# Patient Record
Sex: Female | Born: 1935 | Race: White | Hispanic: No
Health system: Southern US, Community
[De-identification: ages and names within clinical notes are randomized; demographics above are authoritative.]

## PROBLEM LIST (undated history)

## (undated) DIAGNOSIS — G4730 Sleep apnea, unspecified: Secondary | ICD-10-CM

## (undated) DIAGNOSIS — I341 Nonrheumatic mitral (valve) prolapse: Secondary | ICD-10-CM

## (undated) DIAGNOSIS — G629 Polyneuropathy, unspecified: Secondary | ICD-10-CM

## (undated) DIAGNOSIS — I2510 Atherosclerotic heart disease of native coronary artery without angina pectoris: Secondary | ICD-10-CM

## (undated) DIAGNOSIS — I10 Essential (primary) hypertension: Secondary | ICD-10-CM

## (undated) DIAGNOSIS — F0390 Unspecified dementia without behavioral disturbance: Secondary | ICD-10-CM

## (undated) DIAGNOSIS — G459 Transient cerebral ischemic attack, unspecified: Secondary | ICD-10-CM

---

## 2020-05-01 ENCOUNTER — Emergency Department (HOSPITAL_COMMUNITY): Payer: Medicare (Managed Care)

## 2020-05-01 ENCOUNTER — Other Ambulatory Visit: Payer: Self-pay

## 2020-05-01 ENCOUNTER — Emergency Department (HOSPITAL_COMMUNITY)
Admission: EM | Admit: 2020-05-01 | Discharge: 2020-05-02 | Disposition: A | Discharge status: Home / Self Care | Payer: Medicare (Managed Care) | Attending: Emergency Medicine | Admitting: Emergency Medicine

## 2020-05-01 ENCOUNTER — Encounter (HOSPITAL_COMMUNITY): Payer: Self-pay | Admitting: Radiology

## 2020-05-01 DIAGNOSIS — F0390 Unspecified dementia without behavioral disturbance: Secondary | ICD-10-CM | POA: Insufficient documentation

## 2020-05-01 DIAGNOSIS — W1789XA Other fall from one level to another, initial encounter: Secondary | ICD-10-CM | POA: Diagnosis not present

## 2020-05-01 DIAGNOSIS — W19XXXA Unspecified fall, initial encounter: Secondary | ICD-10-CM

## 2020-05-01 DIAGNOSIS — I6782 Cerebral ischemia: Secondary | ICD-10-CM | POA: Insufficient documentation

## 2020-05-01 DIAGNOSIS — M25551 Pain in right hip: Secondary | ICD-10-CM | POA: Diagnosis present

## 2020-05-01 NOTE — Discharge Instructions (Addendum)
CT head and neck without any acute injury.  May be age-indeterminate endplate T1 injury.  Patient without any tenderness there so probably old.  But this would be stable if even acute.  X-rays of both hips and pelvis without any bony abnormalities.  Patient stable for discharge back to facility.

## 2020-05-01 NOTE — ED Provider Notes (Signed)
[facility]-EMERGENCY DEPT Provider Note   CSN: [id number] Arrival date & time: 05/01/20  1856     History Chief Complaint  Patient presents with  . Fall    Maureen Schaefer is a 86 y.o. female.  Patient brought in from [facility].  According patient's daughter that she was told that patient stood up and fell backwards.  They stated that she was complaining of left side however has seem to have pain on the right hip area.  She arrived with c-collar in place.  Patient has dementia according to daughter.  Did have her Covid vaccines.  Patient denies any complaints currently.  Daughter says mental status is baseline.        History reviewed. No pertinent past medical history.  There are no problems to display for this patient.     OB History   No obstetric history on file.     No family history on file.  Social History   Tobacco Use  . Smoking status: Not on file  Substance Use Topics  . Alcohol use: Not on file  . Drug use: Not on file    Home Medications Prior to Admission medications   Medication Sig Start Date End Date Taking? Authorizing Provider  acetaminophen (TYLENOL) 500 MG tablet Take 1,000 mg by mouth in the morning, at noon, and at bedtime.   Yes [provider]  alum & mag hydroxide-simeth (GERI-LANTA) 200-200-20 MG/5ML suspension Take 30 mLs by mouth every 8 (eight) hours as needed for indigestion or heartburn.   Yes [provider]  ARIPiprazole (ABILIFY) 2 MG tablet Take 2 mg by mouth daily.   Yes [provider]  celecoxib (CELEBREX) 100 MG capsule Take 100 mg by mouth daily.   Yes [provider]  cholecalciferol (VITAMIN D3) 25 MCG (1000 UNIT) tablet Take 1,000 Units by mouth daily.   Yes [provider]  Cranberry 200 MG CAPS Take 600 mg by mouth daily.   Yes [provider]  donepezil (ARICEPT) 10 MG tablet Take 10 mg by mouth at bedtime.   Yes [provider]    escitalopram (LEXAPRO) 10 MG tablet Take 10 mg by mouth daily.   Yes [provider]  estradiol (ESTRACE) 0.1 MG/GM vaginal cream Place 1 Applicatorful vaginally 3 (three) times a week.   Yes [provider]  levothyroxine (SYNTHROID) 50 MCG tablet Take 50 mcg by mouth daily before breakfast.   Yes [provider]  loperamide (IMODIUM A-D) 2 MG tablet Take 2 mg by mouth daily as needed for diarrhea or loose stools.   Yes [provider]  magnesium hydroxide (MILK OF MAGNESIA) 400 MG/5ML suspension Take 30 mLs by mouth daily as needed (diarrhea).   Yes [provider]  metoprolol tartrate (LOPRESSOR) 25 MG tablet Take 25 mg by mouth daily.   Yes [provider]  nystatin cream (MYCOSTATIN) Apply 1 application topically daily.   Yes [provider]  polyethylene glycol (MIRALAX / GLYCOLAX) 17 g packet Take 17 g by mouth daily.   Yes [provider]  polyvinyl alcohol (LIQUIFILM TEARS) 1.4 % ophthalmic solution Place 2 drops into both eyes in the morning, at noon, and at bedtime.   Yes [provider]  rosuvastatin (CRESTOR) 5 MG tablet Take 5 mg by mouth as directed. Take on MWF   Yes [provider]  triamcinolone (NASACORT) 55 MCG/ACT AERO nasal inhaler Place 2 sprays into the nose daily.   Yes  [provider]    Allergies    Coconut flavor  Review of Systems   Review of Systems  Unable to perform ROS: Dementia    Physical Exam Updated Vital Signs BP (!) 174/67 (BP Location: Right Arm)   Pulse (!) 58   Temp 98.6 F (37 C)   Resp 16   SpO2 98%   Physical Exam Vitals and nursing note reviewed.  Constitutional:      General: She is not in acute distress.    Appearance: Normal appearance. She is well-developed.  HENT:     Head: Normocephalic and atraumatic.  Eyes:     Extraocular Movements: Extraocular movements intact.     Conjunctiva/sclera: Conjunctivae normal.     Pupils:  Pupils are equal, round, and reactive to light.  Neck:     Comments: C-collar in place. Cardiovascular:     Rate and Rhythm: Normal rate and regular rhythm.     Heart sounds: No murmur heard.   Pulmonary:     Effort: Pulmonary effort is normal. No respiratory distress.     Breath sounds: Normal breath sounds.  Abdominal:     Palpations: Abdomen is soft.     Tenderness: There is no abdominal tenderness.  Musculoskeletal:        General: No tenderness or deformity.     Cervical back: Neck supple.     Comments: No pain with range of motion of both lower legs.  In particular no pain in the hip area.  No deformity.  Skin:    General: Skin is warm and dry.  Neurological:     Mental Status: She is alert. Mental status is at baseline.     Comments: Patient alert     ED Results / Procedures / Treatments   Labs (all labs ordered are listed, but only abnormal results are displayed) Labs Reviewed - No data to display  EKG None  Radiology CT Head Wo Contrast  Result Date: 05/01/2020 CLINICAL DATA:  Found on floor EXAM: CT HEAD WITHOUT CONTRAST CT CERVICAL SPINE WITHOUT CONTRAST TECHNIQUE: Multidetector CT imaging of the head and cervical spine was performed following the standard protocol without intravenous contrast. Multiplanar CT image reconstructions of the cervical spine were also generated. COMPARISON:  None. FINDINGS: CT HEAD FINDINGS Brain: No acute territorial infarction, hemorrhage or intracranial mass. Advanced atrophy. Moderate hypodensity in the white matter consistent with chronic small vessel ischemic change. Mildly prominent ventricles, felt secondary to atrophy. Chronic appearing lacunar infarcts in the right basal ganglia. Vascular: No hyperdense vessels.  Carotid vascular calcification Skull: Normal. Negative for fracture or focal lesion. Sinuses/Orbits: No acute finding. Other: None CT CERVICAL SPINE FINDINGS Alignment: Trace anterolisthesis C3 on C4, C4 on C5 and trace  retrolisthesis C5 on C6. Facet alignment is maintained. Skull base and vertebrae: Age indeterminate mild superior endplate deformity at T1. Remaining vertebral bodies demonstrate normal stature. Soft tissues and spinal canal: No prevertebral fluid or swelling. No visible canal hematoma. Disc levels: Degenerative changes at multiple levels, most advanced at C5-C6. Facet degenerative changes at multiple levels. Upper chest: Negative. Other: None IMPRESSION: 1. No CT evidence for acute intracranial abnormality. Atrophy and chronic small vessel ischemic changes of the white matter. 2. Age indeterminate mild superior endplate deformity at T1. Otherwise no acute osseous abnormality of the cervical spine. Electronically Signed   By: Jasmine Pang M.D.   On: 05/01/2020 22:30   CT Cervical Spine Wo Contrast  Result Date: 05/01/2020 CLINICAL DATA:  Found on  floor EXAM: CT HEAD WITHOUT CONTRAST CT CERVICAL SPINE WITHOUT CONTRAST TECHNIQUE: Multidetector CT imaging of the head and cervical spine was performed following the standard protocol without intravenous contrast. Multiplanar CT image reconstructions of the cervical spine were also generated. COMPARISON:  None. FINDINGS: CT HEAD FINDINGS Brain: No acute territorial infarction, hemorrhage or intracranial mass. Advanced atrophy. Moderate hypodensity in the white matter consistent with chronic small vessel ischemic change. Mildly prominent ventricles, felt secondary to atrophy. Chronic appearing lacunar infarcts in the right basal ganglia. Vascular: No hyperdense vessels.  Carotid vascular calcification Skull: Normal. Negative for fracture or focal lesion. Sinuses/Orbits: No acute finding. Other: None CT CERVICAL SPINE FINDINGS Alignment: Trace anterolisthesis C3 on C4, C4 on C5 and trace retrolisthesis C5 on C6. Facet alignment is maintained. Skull base and vertebrae: Age indeterminate mild superior endplate deformity at T1. Remaining vertebral bodies demonstrate normal  stature. Soft tissues and spinal canal: No prevertebral fluid or swelling. No visible canal hematoma. Disc levels: Degenerative changes at multiple levels, most advanced at C5-C6. Facet degenerative changes at multiple levels. Upper chest: Negative. Other: None IMPRESSION: 1. No CT evidence for acute intracranial abnormality. Atrophy and chronic small vessel ischemic changes of the white matter. 2. Age indeterminate mild superior endplate deformity at T1. Otherwise no acute osseous abnormality of the cervical spine. Electronically Signed   By: Jasmine Pang M.D.   On: 05/01/2020 22:30   DG Hip Unilat W or Wo Pelvis 2-3 Views Right  Result Date: 05/01/2020 CLINICAL DATA:  Fall, right hip pain EXAM: DG HIP (WITH OR WITHOUT PELVIS) 2-3V RIGHT COMPARISON:  None. FINDINGS: Single view radiograph the pelvis and two view radiograph of the right hip demonstrates surgical changes of bilateral total hip arthroplasty. Left hip arthroplasty is incompletely visualized on this exam. Normal alignment. No fracture or dislocation. Soft tissues are unremarkable. IMPRESSION: Bilateral total hip arthroplasty. No acute findings. Electronically Signed   By: Helyn Numbers MD   On: 05/01/2020 21:22    Procedures Procedures (including critical care time)  Medications Ordered in ED Medications - No data to display  ED Course  I have reviewed the triage vital signs and the nursing notes.  Pertinent labs & imaging results that were available during my care of the patient were reviewed by me and considered in my medical decision making (see chart for details).    MDM Rules/Calculators/A&P                          Work-up for the fall CT head without any acute findings.  CT cervical spine raise some concerns about T1 endplate injury.  But age-indeterminate.  Patient not tender once c-collar was removed in that area for sure.  Patient did complain of some tenderness from the top of the C-spine all the way down to the bottom  of the C-spine.  But did not seem to be any more tender around T1.  X-rays of both hips and pelvis without any bony abnormality.  Patient appears to be in no distress.  Patient stable for discharge back to nursing facility. Final Clinical Impression(s) / ED Diagnoses Final diagnoses:  Fall, initial encounter    Rx / DC Orders ED Discharge Orders    None       Vanetta Mulders, MD 05/01/20 2350

## 2020-05-01 NOTE — ED Triage Notes (Signed)
She was found lying on the floor on her left side, however, she c/o right hip area pain. She arrives in a c-collar, which we maintain. She arrived at [facility] ~ 2 days ago. She sobs and complains incessantly, which staff at [facility] tell EMS she has done since her arrival there. She no longer c/o any hip pain.

## 2020-05-02 NOTE — ED Notes (Signed)
[facility] called for transport, states unknown ETA

## 2020-05-25 ENCOUNTER — Emergency Department (HOSPITAL_COMMUNITY)
Admission: EM | Admit: 2020-05-25 | Discharge: 2020-05-25 | Disposition: A | Discharge status: Skilled Nursing Facility | Payer: PRIVATE HEALTH INSURANCE | Attending: Emergency Medicine | Admitting: Emergency Medicine

## 2020-05-25 ENCOUNTER — Other Ambulatory Visit: Payer: Self-pay

## 2020-05-25 ENCOUNTER — Emergency Department (HOSPITAL_COMMUNITY): Payer: PRIVATE HEALTH INSURANCE

## 2020-05-25 ENCOUNTER — Encounter (HOSPITAL_COMMUNITY): Payer: Self-pay

## 2020-05-25 DIAGNOSIS — Z79899 Other long term (current) drug therapy: Secondary | ICD-10-CM | POA: Diagnosis not present

## 2020-05-25 DIAGNOSIS — I10 Essential (primary) hypertension: Secondary | ICD-10-CM | POA: Insufficient documentation

## 2020-05-25 DIAGNOSIS — Y929 Unspecified place or not applicable: Secondary | ICD-10-CM | POA: Diagnosis not present

## 2020-05-25 DIAGNOSIS — I2510 Atherosclerotic heart disease of native coronary artery without angina pectoris: Secondary | ICD-10-CM | POA: Insufficient documentation

## 2020-05-25 DIAGNOSIS — S0083XA Contusion of other part of head, initial encounter: Secondary | ICD-10-CM | POA: Insufficient documentation

## 2020-05-25 DIAGNOSIS — W19XXXA Unspecified fall, initial encounter: Secondary | ICD-10-CM

## 2020-05-25 DIAGNOSIS — Y939 Activity, unspecified: Secondary | ICD-10-CM | POA: Diagnosis not present

## 2020-05-25 DIAGNOSIS — R0789 Other chest pain: Secondary | ICD-10-CM | POA: Insufficient documentation

## 2020-05-25 DIAGNOSIS — W1839XA Other fall on same level, initial encounter: Secondary | ICD-10-CM | POA: Insufficient documentation

## 2020-05-25 DIAGNOSIS — Y999 Unspecified external cause status: Secondary | ICD-10-CM | POA: Diagnosis not present

## 2020-05-25 DIAGNOSIS — F0390 Unspecified dementia without behavioral disturbance: Secondary | ICD-10-CM | POA: Diagnosis not present

## 2020-05-25 HISTORY — DX: Nonrheumatic mitral (valve) prolapse: I34.1

## 2020-05-25 HISTORY — DX: Transient cerebral ischemic attack, unspecified: G45.9

## 2020-05-25 HISTORY — DX: Essential (primary) hypertension: I10

## 2020-05-25 HISTORY — DX: Sleep apnea, unspecified: G47.30

## 2020-05-25 HISTORY — DX: Unspecified dementia, unspecified severity, without behavioral disturbance, psychotic disturbance, mood disturbance, and anxiety: F03.90

## 2020-05-25 HISTORY — DX: Polyneuropathy, unspecified: G62.9

## 2020-05-25 HISTORY — DX: Atherosclerotic heart disease of native coronary artery without angina pectoris: I25.10

## 2020-05-25 MED ORDER — ACETAMINOPHEN 500 MG PO TABS: 1000.0000 mg | ORAL_TABLET | Freq: Four times a day (QID) | ORAL | 0 refills | Status: DC | PRN

## 2020-05-25 NOTE — ED Provider Notes (Signed)
[facility]-EMER[facility]PT Provider Note   CSN: [id number] Arrival date & time: 05/25/20  1856     History Chief Complaint  Patient presents with  . Fall    Maureen Schaefer is a 86 y.o. female.  HPI Patient had a fall at SNF yesterday.  She was not transported at that time.  Today, nursing staff had concerned that patient expressed right hip pain.  Patient had a contusion to the face yesterday that was bandaged.  Patient cannot give any additional history.  She has severe dementia.    Past Medical History:  Diagnosis Date  . CAD (coronary artery disease)   . Dementia (HCC)   . Hypertension   . Mitral valve prolapse   . Polyneuropathy   . Sleep apnea   . TIA (transient ischemic attack)     There are no problems to display for this patient.      OB History   No obstetric history on file.     No family history on file.  Social History   Tobacco Use  . Smoking status: Unknown If Ever Smoked  Substance Use Topics  . Alcohol use: Not Currently  . Drug use: Never    Home Medications Prior to Admission medications   Medication Sig Start Date End Date Taking? Authorizing Provider  acetaminophen (TYLENOL) 500 MG tablet Take 1,000 mg by mouth in the morning, at noon, and at bedtime.    [provider]  acetaminophen (TYLENOL) 500 MG tablet Take 2 tablets (1,000 mg total) by mouth every 6 (six) hours as needed for moderate pain. 05/25/20   Arby Barrette, MD  alum & mag hydroxide-simeth [facility]) 200-200-20 MG/5ML suspension Take 30 mLs by mouth every 8 (eight) hours as needed for indigestion or heartburn.    [provider]  ARIPiprazole (ABILIFY) 2 MG tablet Take 2 mg by mouth daily.    [provider]  celecoxib (CELEBREX) 100 MG capsule Take 100 mg by mouth daily.    [provider]  cholecalciferol (VITAMIN D3) 25 MCG (1000 UNIT) tablet Take 1,000 Units by mouth daily.    [provider]  Cranberry  200 MG CAPS Take 600 mg by mouth daily.    [provider]  donepezil (ARICEPT) 10 MG tablet Take 10 mg by mouth at bedtime.    [provider]  escitalopram (LEXAPRO) 10 MG tablet Take 10 mg by mouth daily.    [provider]  estradiol (ESTRACE) 0.1 MG/GM vaginal cream Place 1 Applicatorful vaginally 3 (three) times a week.    [provider]  levothyroxine (SYNTHROID) 50 MCG tablet Take 50 mcg by mouth daily before breakfast.    [provider]  loperamide (IMODIUM A-D) 2 MG tablet Take 2 mg by mouth daily as needed for diarrhea or loose stools.    [provider]  magnesium hydroxide (MILK OF MAGNESIA) 400 MG/5ML suspension Take 30 mLs by mouth daily as needed (diarrhea).    [provider]  metoprolol tartrate (LOPRESSOR) 25 MG tablet Take 25 mg by mouth daily.    [provider]  nystatin cream (MYCOSTATIN) Apply 1 application topically daily.    [provider]  polyethylene glycol (MIRALAX / GLYCOLAX) 17 g packet Take 17 g by mouth daily.    [provider]  polyvinyl alcohol (LIQUIFILM TEARS) 1.4 % ophthalmic solution Place 2 drops into both eyes in the morning, at noon, and at bedtime.    [provider]  rosuvastatin (CRESTOR) 5 MG tablet Take 5 mg by mouth as directed. Take on MWF    [provider]  triamcinolone (NASACORT) 55 MCG/ACT AERO nasal inhaler Place 2 sprays into the nose daily.    [provider]    Allergies    Coconut flavor  Review of Systems   Review of Systems Level 5 caveat cannot obtain review of systems due to dementia physical Exam Updated Vital Signs BP (!) 155/72 (BP Location: Right Arm)   Pulse 74   Temp 97.6 F (36.4 C) (Oral)   Resp 17   SpO2 97%   Physical Exam Constitutional:      Comments: Patient resting quietly as I enter the room.  No evidence of distress.  No respiratory distress.  Once awake she is responsive and  appropriately interactive but clearly has dementia.  HENT:     Head:     Comments: Periorbital hematoma with only mild swelling on the left.  A small brow laceration that is clean and dry without active bleeding.  No other facial contusions or abrasions.    Nose: Nose normal.     Mouth/Throat:     Mouth: Mucous membranes are moist.     Pharynx: Oropharynx is clear.  Eyes:     Extraocular Movements: Extraocular movements intact.     Pupils: Pupils are equal, round, and reactive to light.     Comments: Normal extraocular motions.  Neck:     Comments: Denies any C-spine tenderness to palpation. Cardiovascular:     Rate and Rhythm: Normal rate and regular rhythm.     Comments: Occasional ectopy. Pulmonary:     Effort: Pulmonary effort is normal.     Breath sounds: Normal breath sounds.     Comments: Patient generally endorses some tenderness to palpation of the chest wall.  However chest wall is normal in appearance without any abrasions or contusions.  No crepitus. Abdominal:     General: There is no distension.     Palpations: Abdomen is soft.     Comments: Abdomen is soft and nondistended.  Patient diffusely endorses discomfort anywhere I palpate.  No guarding.  Musculoskeletal:     Cervical back: Neck supple.     Comments: Extremities are in good condition.  There are no deformities or areas of swelling.  I can move both arms without any sign of injury.  Both lower extremities are symmetric.  They are in very good condition for age.  There are no effusions contusions or abrasions.  Patient however complains of pain anytime I move either leg at all.  Skin:    General: Skin is warm and dry.  Neurological:     Comments: Patient is alert but clearly confused.  She does not appear to have focal motor deficits.     ED Results / Procedures / Treatments   Labs (all labs ordered are listed, but only abnormal results are displayed) Labs Reviewed - No data to  display  EKG None  Radiology CT Head Wo Contrast  Result Date: 05/25/2020 CLINICAL DATA:  Head trauma, minor (Age >= 65y) EXAM: CT HEAD WITHOUT CONTRAST TECHNIQUE: Contiguous axial images were obtained from the base of the skull through the vertex without intravenous contrast. COMPARISON:  Recent prior head CT 05/01/2020 FINDINGS: Brain: No evidence of acute infarction, hemorrhage, hydrocephalus, extra-axial collection or mass lesion/mass effect. Similar degree of significant cortical and central atrophy with resultant ex vacuo dilatation of the lateral ventricles. Periventricular and subcortical white matter  hypoattenuation also remains unchanged. While nonspecific, the appearance is most consistent with chronic microvascular ischemic white matter disease. Vascular: Atherosclerotic calcifications in the bilateral cavernous carotid arteries. Skull: Normal. Negative for fracture or focal lesion. Sinuses/Orbits: No acute finding. Other: None. IMPRESSION: 1. No acute intracranial abnormality. 2. Stable appearance of atrophy, ex vacuo ventriculomegaly and chronic microvascular ischemic white matter disease compared to recent prior imaging. Electronically Signed   By: Malachy Moan M.D.   On: 05/25/2020 09:37   DG Hip Unilat With Pelvis 2-3 Views Right  Result Date: 05/25/2020 CLINICAL DATA:  Larey Seat 1 day ago.  Right hip pain.  Pelvic pain. EXAM: DG HIP (WITH OR WITHOUT PELVIS) 2-3V RIGHT COMPARISON:  05/01/2020 FINDINGS: Right total hip arthroplasty is well seated and well aligned. No fracture. No bone lesion. Well-positioned left total hip arthroplasty. SI joints and symphysis pubis are normally spaced and aligned. Soft tissues are unremarkable. IMPRESSION: 1. No fracture, bone lesion or evidence of loosening of the hip joint orthopedic hardware. Electronically Signed   By: Amie Portland M.D.   On: 05/25/2020 09:40    Procedures Procedures (including critical care time)  Medications Ordered in  ED Medications - No data to display  ED Course  I have reviewed the triage vital signs and the nursing notes.  Pertinent labs & imaging results that were available during my care of the patient were reviewed by me and considered in my medical decision making (see chart for details).    MDM Rules/Calculators/A&P                         Patient fell yesterday.  The stated concern was that nursing staff perceived patient had right hip pain.  Physical exam only identifies periorbital hematoma on the left.  Otherwise, there are no areas of contusions abrasions, deformities.  Unfortunately, patient generally endorses pain everywhere.  I have obtain CT head due to objective head injury no acute findings noted.  Also based on nursing concern for hip pain pelvis and right hip obtained without acute findings.  At this time by physical exam there are no other suspected fracture injuries.  Patient will return to nursing care and can have acetaminophen every 6 hours as needed for pain.  Recommendation is for reexamination by facility provider for any concerns. Final Clinical Impression(s) / ED Diagnoses Final diagnoses:  Fall, initial encounter  Contusion of face, initial encounter    Rx / DC Orders ED Discharge Orders         Ordered    acetaminophen (TYLENOL) 500 MG tablet  Every 6 hours PRN        05/25/20 0948           Arby Barrette, MD 05/25/20 [phone]

## 2020-05-25 NOTE — ED Notes (Signed)
Report given to [facility] at [facility].

## 2020-05-25 NOTE — Discharge Instructions (Signed)
1.  CT scan of the head does not show any bleeding or internal brain injury.  X-ray was taken of the patient's hips and pelvis without identification of fracture. 2.  Patient complains of pain in all examined locations however only objective injury identified is facial contusion.  No apparent extremity fractures or significant injuries by physical exam.  Continue to care for the patient to provide comfort and return if other concerning findings are identified.  Please have patient reexamined by facility physician within the next several days. 3.  Give extra strength Tylenol every 6-8 hours as needed.

## 2020-05-25 NOTE — ED Notes (Signed)
[facility] notified need for transport  

## 2020-05-25 NOTE — ED Triage Notes (Addendum)
Pt arrives EMS from [facility]  Per EMS: pt had a fall yesterday at the facility- family refused transportation to the hospital yesterday. This am staff reports that the pt is complaining of right hip and leg pain. No obvious deformity. Pt is altered at baseline. Per staff: pt is at baseline. EMS notes a hematoma above left eye and lac to the brow that was bandaged.

## 2020-08-01 ENCOUNTER — Encounter (HOSPITAL_COMMUNITY): Payer: Self-pay

## 2020-08-01 ENCOUNTER — Emergency Department (HOSPITAL_COMMUNITY)
Admission: EM | Admit: 2020-08-01 | Discharge: 2020-08-01 | Disposition: A | Discharge status: Home / Self Care | Payer: Medicare Other | Attending: Emergency Medicine | Admitting: Emergency Medicine

## 2020-08-01 ENCOUNTER — Emergency Department (HOSPITAL_COMMUNITY): Payer: Medicare Other

## 2020-08-01 DIAGNOSIS — I10 Essential (primary) hypertension: Secondary | ICD-10-CM | POA: Insufficient documentation

## 2020-08-01 DIAGNOSIS — I2510 Atherosclerotic heart disease of native coronary artery without angina pectoris: Secondary | ICD-10-CM | POA: Insufficient documentation

## 2020-08-01 DIAGNOSIS — S0990XA Unspecified injury of head, initial encounter: Secondary | ICD-10-CM | POA: Insufficient documentation

## 2020-08-01 DIAGNOSIS — W050XXA Fall from non-moving wheelchair, initial encounter: Secondary | ICD-10-CM | POA: Insufficient documentation

## 2020-08-01 DIAGNOSIS — Z79899 Other long term (current) drug therapy: Secondary | ICD-10-CM | POA: Diagnosis not present

## 2020-08-01 DIAGNOSIS — R001 Bradycardia, unspecified: Secondary | ICD-10-CM | POA: Diagnosis not present

## 2020-08-01 DIAGNOSIS — F0390 Unspecified dementia without behavioral disturbance: Secondary | ICD-10-CM | POA: Diagnosis not present

## 2020-08-01 DIAGNOSIS — Y92129 Unspecified place in nursing home as the place of occurrence of the external cause: Secondary | ICD-10-CM | POA: Insufficient documentation

## 2020-08-01 NOTE — Discharge Instructions (Addendum)
RETURN TO ER IF ANY VOMITING, LETHARGY, CONFUSION, OR SUDDEN CHANGES IN MENTAL STATUS.

## 2020-08-01 NOTE — ED Triage Notes (Signed)
BIB EMS from [facility] for a forward fall from w/c with no injury noted, but baseline of dementia and therefore c spine immobilized for protocol. Patient with c/o generalized pain.

## 2020-08-01 NOTE — ED Provider Notes (Signed)
[name] EMERGENCY DEPARTMENT Provider Note   CSN: [id number] Arrival date & time: 08/01/20  1035     History Chief Complaint  Patient presents with  . Fall    Maureen Schaefer is a 86 y.o. female.  86yo F w/ PMH below including dementia, CAD, HTN, TIA who p/w fall. Just PTA, pt was at her nursing facility when she fell forward out of a wheelchair and hit her head. No LOC, is at neuro baseline per facility. No apparent injuries from the fall. Placed in C-collar by EMS. Stable VS during transport.  LEVEL 5 CAVEAT DUE TO DEMENTIA  The history is provided by the EMS personnel and the nursing home.  Fall       Past Medical History:  Diagnosis Date  . CAD (coronary artery disease)   . Dementia (HCC)   . Hypertension   . Mitral valve prolapse   . Polyneuropathy   . Sleep apnea   . TIA (transient ischemic attack)     There are no problems to display for this patient.    OB History   No obstetric history on file.     No family history on file.  Social History   Tobacco Use  . Smoking status: Unknown If Ever Smoked  Substance Use Topics  . Alcohol use: Not Currently  . Drug use: Never    Home Medications Prior to Admission medications   Medication Sig Start Date End Date Taking? Authorizing Provider  acetaminophen (TYLENOL) 500 MG tablet Take 1,000 mg by mouth in the morning, at noon, and at bedtime.    [provider]  acetaminophen (TYLENOL) 500 MG tablet Take 2 tablets (1,000 mg total) by mouth every 6 (six) hours as needed for moderate pain. 05/25/20   Arby Barrette, MD  alum & mag hydroxide-simeth [facility]) 200-200-20 MG/5ML suspension Take 30 mLs by mouth every 8 (eight) hours as needed for indigestion or heartburn.    [provider]  ARIPiprazole (ABILIFY) 2 MG tablet Take 2 mg by mouth daily.    [provider]  celecoxib (CELEBREX) 100 MG capsule Take 100 mg by mouth daily.    [provider]    cholecalciferol (VITAMIN D3) 25 MCG (1000 UNIT) tablet Take 1,000 Units by mouth daily.    [provider]  Cranberry 200 MG CAPS Take 600 mg by mouth daily.    [provider]  donepezil (ARICEPT) 10 MG tablet Take 10 mg by mouth at bedtime.    [provider]  escitalopram (LEXAPRO) 20 MG tablet Take 20 mg by mouth daily. 07/31/20   [provider]  estradiol (ESTRACE) 0.1 MG/GM vaginal cream Place 1 Applicatorful vaginally 3 (three) times a week.    [provider]  levothyroxine (SYNTHROID) 50 MCG tablet Take 50 mcg by mouth daily before breakfast.    [provider]  loperamide (IMODIUM A-D) 2 MG tablet Take 2 mg by mouth daily as needed for diarrhea or loose stools.    [provider]  magnesium hydroxide (MILK OF MAGNESIA) 400 MG/5ML suspension Take 30 mLs by mouth daily as needed (diarrhea).    [provider]  metoprolol tartrate (LOPRESSOR) 25 MG tablet Take 25 mg by mouth daily.    [provider]  nystatin cream (MYCOSTATIN) Apply 1 application topically daily.    [provider]  polyethylene glycol (MIRALAX / GLYCOLAX) 17 g packet Take 17 g by mouth daily.    [provider]  polyvinyl alcohol (LIQUIFILM TEARS) 1.4 % ophthalmic solution Place 2 drops into both eyes in the morning, at noon, and at bedtime.    [provider]  rosuvastatin (CRESTOR) 5 MG tablet Take 5 mg by mouth as directed. Take on MWF    [provider]  triamcinolone (NASACORT) 55 MCG/ACT AERO nasal inhaler Place 2 sprays into the nose daily.    [provider]    Allergies    Coconut flavor  Review of Systems   Review of Systems  Unable to perform ROS: Dementia    Physical Exam Updated Vital Signs BP (!) 148/59   Pulse (!) 56   Temp 97.9 F (36.6 C) (Axillary)   Resp 13   Ht 5\' 6"  (1.676 m)   Wt 60 kg Comment: stretcher scale  SpO2 98%   BMI 21.35 kg/m   Physical  Exam Vitals and nursing note reviewed.  Constitutional:      General: She is not in acute distress.    Appearance: She is well-developed.  HENT:     Head: Normocephalic and atraumatic.     Comments: No hematoma or abrasions    Nose: Nose normal.  Eyes:     Conjunctiva/sclera: Conjunctivae normal.     Pupils: Pupils are equal, round, and reactive to light.  Neck:     Comments: In c-collar Cardiovascular:     Rate and Rhythm: Regular rhythm. Bradycardia present.     Heart sounds: Normal heart sounds. No murmur heard.   Pulmonary:     Effort: Pulmonary effort is normal.     Breath sounds: Normal breath sounds.  Chest:     Chest wall: No tenderness.  Abdominal:     General: Bowel sounds are normal. There is no distension.     Palpations: Abdomen is soft.     Tenderness: There is no abdominal tenderness.  Musculoskeletal:        General: No deformity or signs of injury. Normal range of motion.  Skin:    General: Skin is warm and dry.     Comments: Scattered old ecchymoses b/l legs and small abrasion R knee  Neurological:     Mental Status: She is alert.     Comments: Disoriented, fluent speech  Psychiatric:        Cognition and Memory: Memory is impaired.     ED Results / Procedures / Treatments   Labs (all labs ordered are listed, but only abnormal results are displayed) Labs Reviewed - No data to display  EKG None  Radiology No results found.  Procedures Procedures (including critical care time)  Medications Ordered in ED Medications - No data to display  ED Course  I have reviewed the triage vital signs and the nursing notes.  Pertinent imaging results that were available during my care of the patient were reviewed by me and considered in my medical decision making (see chart for details).    MDM Rules/Calculators/A&P                          Pt comfortable on exam, no external signs of trauma. Because of head injury, obtained CT head and c-spine which  were negative for acute injury. Pt stable on reassessment, discharged back to nursing facility with return precautions provided on d/c paperwork.  Final Clinical Impression(s) / ED Diagnoses Final diagnoses:  Minor head injury, initial encounter    Rx / DC Orders ED Discharge Orders  None       Deantae Shackleton, Ambrose Finland, MD 08/01/20 1419

## 2020-08-01 NOTE — ED Notes (Addendum)
Attempted to call report x 3 to facility. First two times phone was answered and then the line was disconnected. The third time the phone was answered and then this RN was sent to voicemail. Will get transport set up for pt

## 2020-08-01 NOTE — ED Notes (Signed)
Pt discharged back to nursing home by [facility]. Daughter aware of discharge instructions.

## 2020-08-01 NOTE — ED Notes (Signed)
[facility] called @ 1456-per Vernona Rieger, RN called by Marylene Land

## 2020-09-12 ENCOUNTER — Inpatient Hospital Stay (HOSPITAL_COMMUNITY)
Admission: EM | Admit: 2020-09-12 | Discharge: 2020-09-17 | DRG: 812 | Disposition: A | Discharge status: Nursing Home (Includes ICF) | Payer: Medicare Other | Attending: Internal Medicine | Admitting: Internal Medicine

## 2020-09-12 ENCOUNTER — Encounter (HOSPITAL_COMMUNITY): Payer: Self-pay

## 2020-09-12 DIAGNOSIS — Z79899 Other long term (current) drug therapy: Secondary | ICD-10-CM

## 2020-09-12 DIAGNOSIS — Z6822 Body mass index (BMI) 22.0-22.9, adult: Secondary | ICD-10-CM

## 2020-09-12 DIAGNOSIS — D500 Iron deficiency anemia secondary to blood loss (chronic): Principal | ICD-10-CM | POA: Diagnosis present

## 2020-09-12 DIAGNOSIS — B9620 Unspecified Escherichia coli [E. coli] as the cause of diseases classified elsewhere: Secondary | ICD-10-CM | POA: Diagnosis present

## 2020-09-12 DIAGNOSIS — Z66 Do not resuscitate: Secondary | ICD-10-CM | POA: Diagnosis present

## 2020-09-12 DIAGNOSIS — D649 Anemia, unspecified: Secondary | ICD-10-CM

## 2020-09-12 DIAGNOSIS — I10 Essential (primary) hypertension: Secondary | ICD-10-CM | POA: Diagnosis present

## 2020-09-12 DIAGNOSIS — Z8673 Personal history of transient ischemic attack (TIA), and cerebral infarction without residual deficits: Secondary | ICD-10-CM | POA: Diagnosis not present

## 2020-09-12 DIAGNOSIS — E440 Moderate protein-calorie malnutrition: Secondary | ICD-10-CM | POA: Diagnosis present

## 2020-09-12 DIAGNOSIS — F0390 Unspecified dementia without behavioral disturbance: Secondary | ICD-10-CM | POA: Diagnosis present

## 2020-09-12 DIAGNOSIS — G4733 Obstructive sleep apnea (adult) (pediatric): Secondary | ICD-10-CM | POA: Diagnosis present

## 2020-09-12 DIAGNOSIS — Z791 Long term (current) use of non-steroidal anti-inflammatories (NSAID): Secondary | ICD-10-CM

## 2020-09-12 DIAGNOSIS — R296 Repeated falls: Secondary | ICD-10-CM | POA: Diagnosis present

## 2020-09-12 DIAGNOSIS — N3090 Cystitis, unspecified without hematuria: Secondary | ICD-10-CM | POA: Diagnosis present

## 2020-09-12 DIAGNOSIS — Z7951 Long term (current) use of inhaled steroids: Secondary | ICD-10-CM

## 2020-09-12 DIAGNOSIS — I248 Other forms of acute ischemic heart disease: Secondary | ICD-10-CM | POA: Diagnosis present

## 2020-09-12 DIAGNOSIS — Z20822 Contact with and (suspected) exposure to covid-19: Secondary | ICD-10-CM | POA: Diagnosis present

## 2020-09-12 DIAGNOSIS — I4891 Unspecified atrial fibrillation: Secondary | ICD-10-CM

## 2020-09-12 DIAGNOSIS — K921 Melena: Secondary | ICD-10-CM | POA: Insufficient documentation

## 2020-09-12 DIAGNOSIS — I2510 Atherosclerotic heart disease of native coronary artery without angina pectoris: Secondary | ICD-10-CM | POA: Diagnosis present

## 2020-09-12 DIAGNOSIS — G629 Polyneuropathy, unspecified: Secondary | ICD-10-CM | POA: Diagnosis present

## 2020-09-12 DIAGNOSIS — R112 Nausea with vomiting, unspecified: Secondary | ICD-10-CM | POA: Diagnosis present

## 2020-09-12 DIAGNOSIS — L89221 Pressure ulcer of left hip, stage 1: Secondary | ICD-10-CM | POA: Diagnosis present

## 2020-09-12 DIAGNOSIS — E039 Hypothyroidism, unspecified: Secondary | ICD-10-CM | POA: Diagnosis present

## 2020-09-12 DIAGNOSIS — E785 Hyperlipidemia, unspecified: Secondary | ICD-10-CM | POA: Diagnosis present

## 2020-09-12 DIAGNOSIS — Z79890 Hormone replacement therapy (postmenopausal): Secondary | ICD-10-CM

## 2020-09-12 DIAGNOSIS — I341 Nonrheumatic mitral (valve) prolapse: Secondary | ICD-10-CM | POA: Diagnosis present

## 2020-09-12 DIAGNOSIS — K922 Gastrointestinal hemorrhage, unspecified: Secondary | ICD-10-CM

## 2020-09-12 DIAGNOSIS — L89151 Pressure ulcer of sacral region, stage 1: Secondary | ICD-10-CM | POA: Diagnosis present

## 2020-09-12 DIAGNOSIS — M25512 Pain in left shoulder: Secondary | ICD-10-CM | POA: Diagnosis not present

## 2020-09-12 DIAGNOSIS — N3080 Other cystitis without hematuria: Secondary | ICD-10-CM | POA: Diagnosis not present

## 2020-09-12 DIAGNOSIS — L8990 Pressure ulcer of unspecified site, unspecified stage: Secondary | ICD-10-CM | POA: Insufficient documentation

## 2020-09-12 DIAGNOSIS — Z91018 Allergy to other foods: Secondary | ICD-10-CM

## 2020-09-12 LAB — RESP PANEL BY RT-PCR (FLU A&B, COVID) ARPGX2
Influenza A by PCR: NEGATIVE
Influenza B by PCR: NEGATIVE
SARS Coronavirus 2 by RT PCR: NEGATIVE

## 2020-09-12 LAB — URINALYSIS, ROUTINE W REFLEX MICROSCOPIC
Bilirubin Urine: NEGATIVE
Glucose, UA: NEGATIVE mg/dL
Hgb urine dipstick: NEGATIVE
Ketones, ur: NEGATIVE mg/dL
Leukocytes,Ua: NEGATIVE
Nitrite: POSITIVE — AB
Protein, ur: NEGATIVE mg/dL
Specific Gravity, Urine: 1.02 (ref 1.005–1.030)
pH: 5 (ref 5.0–8.0)

## 2020-09-12 LAB — PROTIME-INR
INR: 1.1 (ref 0.8–1.2)
Prothrombin Time: 13.9 seconds (ref 11.4–15.2)

## 2020-09-12 LAB — MAGNESIUM: Magnesium: 1.9 mg/dL (ref 1.7–2.4)

## 2020-09-12 LAB — CBC
HCT: 27 % — ABNORMAL LOW (ref 36.0–46.0)
Hemoglobin: 7.6 g/dL — ABNORMAL LOW (ref 12.0–15.0)
MCH: 24.2 pg — ABNORMAL LOW (ref 26.0–34.0)
MCHC: 28.1 g/dL — ABNORMAL LOW (ref 30.0–36.0)
MCV: 86 fL (ref 80.0–100.0)
Platelets: 408 10*3/uL — ABNORMAL HIGH (ref 150–400)
RBC: 3.14 MIL/uL — ABNORMAL LOW (ref 3.87–5.11)
RDW: 16.9 % — ABNORMAL HIGH (ref 11.5–15.5)
WBC: 11.5 10*3/uL — ABNORMAL HIGH (ref 4.0–10.5)
nRBC: 0 % (ref 0.0–0.2)

## 2020-09-12 LAB — COMPREHENSIVE METABOLIC PANEL
ALT: 20 U/L (ref 0–44)
AST: 25 U/L (ref 15–41)
Albumin: 2.6 g/dL — ABNORMAL LOW (ref 3.5–5.0)
Alkaline Phosphatase: 133 U/L — ABNORMAL HIGH (ref 38–126)
Anion gap: 11 (ref 5–15)
BUN: 42 mg/dL — ABNORMAL HIGH (ref 8–23)
CO2: 23 mmol/L (ref 22–32)
Calcium: 10 mg/dL (ref 8.9–10.3)
Chloride: 105 mmol/L (ref 98–111)
Creatinine, Ser: 1.01 mg/dL — ABNORMAL HIGH (ref 0.44–1.00)
GFR, Estimated: 53 mL/min — ABNORMAL LOW (ref >60)
Glucose, Bld: 127 mg/dL — ABNORMAL HIGH (ref 70–99)
Potassium: 4.5 mmol/L (ref 3.5–5.1)
Sodium: 139 mmol/L (ref 135–145)
Total Bilirubin: 0.7 mg/dL (ref 0.3–1.2)
Total Protein: 5.9 g/dL — ABNORMAL LOW (ref 6.5–8.1)

## 2020-09-12 LAB — HEMOGLOBIN AND HEMATOCRIT, BLOOD
HCT: 31.4 % — ABNORMAL LOW (ref 36.0–46.0)
Hemoglobin: 10.6 g/dL — ABNORMAL LOW (ref 12.0–15.0)

## 2020-09-12 LAB — TROPONIN I (HIGH SENSITIVITY)
Troponin I (High Sensitivity): 26 ng/L — ABNORMAL HIGH (ref <18)
Troponin I (High Sensitivity): 28 ng/L — ABNORMAL HIGH (ref <18)

## 2020-09-12 LAB — TSH: TSH: 1.783 u[IU]/mL (ref 0.350–4.500)

## 2020-09-12 LAB — LIPASE, BLOOD: Lipase: 28 U/L (ref 11–51)

## 2020-09-12 LAB — POC OCCULT BLOOD, ED: Fecal Occult Bld: POSITIVE — AB

## 2020-09-12 LAB — PREPARE RBC (CROSSMATCH)

## 2020-09-12 MED ORDER — SODIUM CHLORIDE 0.9 % IV SOLN
8.0000 mg/h | INTRAVENOUS | Status: DC
  Filled 2020-09-12: qty 80

## 2020-09-12 MED ORDER — PANTOPRAZOLE SODIUM 40 MG IV SOLR
40.0000 mg | Freq: Two times a day (BID) | INTRAVENOUS | Status: DC
  Administered 2020-09-12 – 2020-09-17 (×10): 40 mg via INTRAVENOUS
  Filled 2020-09-12 (×10): qty 40

## 2020-09-12 MED ORDER — LEVOTHYROXINE SODIUM 50 MCG PO TABS
50.0000 ug | ORAL_TABLET | Freq: Every day | ORAL | Status: DC
  Administered 2020-09-14 – 2020-09-17 (×3): 50 ug via ORAL
  Filled 2020-09-12 (×4): qty 1

## 2020-09-12 MED ORDER — DILTIAZEM HCL-DEXTROSE 125-5 MG/125ML-% IV SOLN (PREMIX)
5.0000 mg/h | INTRAVENOUS | Status: DC
  Administered 2020-09-12 – 2020-09-13 (×2): 5 mg/h via INTRAVENOUS
  Filled 2020-09-12 (×2): qty 125

## 2020-09-12 MED ORDER — ESCITALOPRAM OXALATE 10 MG PO TABS
10.0000 mg | ORAL_TABLET | Freq: Every day | ORAL | Status: DC
  Administered 2020-09-12 – 2020-09-17 (×2): 10 mg via ORAL
  Filled 2020-09-12 (×6): qty 1

## 2020-09-12 MED ORDER — DONEPEZIL HCL 10 MG PO TABS
10.0000 mg | ORAL_TABLET | Freq: Every day | ORAL | Status: DC
  Administered 2020-09-12 – 2020-09-17 (×4): 10 mg via ORAL
  Filled 2020-09-12 (×5): qty 1

## 2020-09-12 MED ORDER — MIRTAZAPINE 7.5 MG PO TABS
7.5000 mg | ORAL_TABLET | Freq: Every day | ORAL | Status: DC
  Administered 2020-09-12 – 2020-09-17 (×4): 7.5 mg via ORAL
  Filled 2020-09-12 (×6): qty 1

## 2020-09-12 MED ORDER — METOPROLOL TARTRATE 25 MG PO TABS
25.0000 mg | ORAL_TABLET | Freq: Every day | ORAL | Status: DC
  Administered 2020-09-12 – 2020-09-17 (×2): 25 mg via ORAL
  Filled 2020-09-12 (×6): qty 1

## 2020-09-12 MED ORDER — ARIPIPRAZOLE 2 MG PO TABS
2.0000 mg | ORAL_TABLET | Freq: Every day | ORAL | Status: DC
  Administered 2020-09-12 – 2020-09-17 (×2): 2 mg via ORAL
  Filled 2020-09-12 (×6): qty 1

## 2020-09-12 MED ORDER — SODIUM CHLORIDE 0.9 % IV BOLUS
500.0000 mL | Freq: Once | INTRAVENOUS | Status: AC
  Administered 2020-09-12: 13:00:00 500 mL via INTRAVENOUS

## 2020-09-12 MED ORDER — SODIUM CHLORIDE 0.9% IV SOLUTION: Freq: Once | INTRAVENOUS | Status: DC

## 2020-09-12 MED ORDER — ROSUVASTATIN CALCIUM 5 MG PO TABS
5.0000 mg | ORAL_TABLET | ORAL | Status: DC
  Filled 2020-09-12 (×4): qty 1

## 2020-09-12 MED ORDER — SODIUM CHLORIDE 0.9 % IV SOLN
80.0000 mg | Freq: Once | INTRAVENOUS | Status: DC
  Filled 2020-09-12: qty 80

## 2020-09-12 MED ORDER — DILTIAZEM LOAD VIA INFUSION
20.0000 mg | Freq: Once | INTRAVENOUS | Status: AC
  Administered 2020-09-12: 14:00:00 20 mg via INTRAVENOUS
  Filled 2020-09-12: qty 20

## 2020-09-12 NOTE — ED Provider Notes (Signed)
[facility] EMERGENCY DEPARTMENT Provider Note   CSN: [id number] Arrival date & time: 09/12/20  0848     History Chief Complaint  Patient presents with  . Emesis    Maureen Schaefer is a 86 y.o. female.  Pt presents to the ED today with n/v this morning.  There is no information on what it looked like.  Pt is from [place] and has severe dementia.  The pt is unable to give any hx.  Pt denies any pain or nausea now.  Pt is very HOH.        Past Medical History:  Diagnosis Date  . CAD (coronary artery disease)   . Dementia (HCC)   . Hypertension   . Mitral valve prolapse   . Polyneuropathy   . Sleep apnea   . TIA (transient ischemic attack)     There are no problems to display for this patient.   History reviewed. No pertinent surgical history.   OB History   No obstetric history on file.     No family history on file.  Social History   Tobacco Use  . Smoking status: Unknown If Ever Smoked  Substance Use Topics  . Alcohol use: Not Currently  . Drug use: Never    Home Medications Prior to Admission medications   Medication Sig Start Date End Date Taking? Authorizing Provider  acetaminophen (TYLENOL) 500 MG tablet Take 2 tablets (1,000 mg total) by mouth every 6 (six) hours as needed for moderate pain. Patient taking differently: Take 1,000 mg by mouth every 8 (eight) hours as needed for moderate pain. 05/25/20  Yes Arby Barrette, MD  ARIPiprazole (ABILIFY) 2 MG tablet Take 2 mg by mouth daily.   Yes [provider]  celecoxib (CELEBREX) 100 MG capsule Take 100 mg by mouth daily.   Yes [provider]  donepezil (ARICEPT) 10 MG tablet Take 10 mg by mouth at bedtime.   Yes [provider]  escitalopram (LEXAPRO) 10 MG tablet Take 10 mg by mouth daily.   Yes [provider]  feeding supplement (BOOST HIGH PROTEIN) LIQD Take 1 Container by mouth 3 (three) times daily between meals. chocolate   Yes  [provider]  polyvinyl alcohol (LIQUIFILM TEARS) 1.4 % ophthalmic solution Place 2 drops into both eyes in the morning, at noon, and at bedtime.   Yes [provider]  alum & mag hydroxide-simeth (GERI-LANTA) 200-200-20 MG/5ML suspension Take 30 mLs by mouth every 8 (eight) hours as needed for indigestion or heartburn.    [provider]  cholecalciferol (VITAMIN D3) 25 MCG (1000 UNIT) tablet Take 1,000 Units by mouth daily.    [provider]  estradiol (ESTRACE) 0.1 MG/GM vaginal cream Place 1 Applicatorful vaginally 3 (three) times a week.    [provider]  levothyroxine (SYNTHROID) 50 MCG tablet Take 50 mcg by mouth daily before breakfast.    [provider]  loperamide (IMODIUM A-D) 2 MG tablet Take 2 mg by mouth daily as needed for diarrhea or loose stools.    [provider]  magnesium hydroxide (MILK OF MAGNESIA) 400 MG/5ML suspension Take 30 mLs by mouth daily as needed (diarrhea).    [provider]  metoprolol tartrate (LOPRESSOR) 25 MG tablet Take 25 mg by mouth daily.    [provider]  polyethylene glycol (MIRALAX / GLYCOLAX) 17 g packet Take 17 g by mouth daily.    [provider]  rosuvastatin (CRESTOR) 5 MG tablet Take  5 mg by mouth as directed. Take on MWF    [provider]  triamcinolone (NASACORT) 55 MCG/ACT AERO nasal inhaler Place 2 sprays into the nose daily.    [provider]    Allergies    Coconut flavor  Review of Systems   Review of Systems  Unable to perform ROS: Dementia    Physical Exam Updated Vital Signs BP 132/75   Pulse (!) 112   Temp 97.9 F (36.6 C) (Oral)   Resp 20   SpO2 100%   Physical Exam Vitals and nursing note reviewed. Exam conducted with a chaperone present.  HENT:     Head: Normocephalic and atraumatic.     Right Ear: External ear normal.     Left Ear: External ear normal.     Nose: Nose normal.     Mouth/Throat:      Mouth: Mucous membranes are dry.  Eyes:     Extraocular Movements: Extraocular movements intact.     Conjunctiva/sclera: Conjunctivae normal.     Pupils: Pupils are equal, round, and reactive to light.  Cardiovascular:     Rate and Rhythm: Tachycardia present. Rhythm regularly irregular.     Pulses: Normal pulses.     Heart sounds: Normal heart sounds.  Pulmonary:     Effort: Pulmonary effort is normal.     Breath sounds: Normal breath sounds.  Abdominal:     General: Abdomen is flat. Bowel sounds are normal.     Palpations: Abdomen is soft.  Genitourinary:    Rectum: Guaiac result positive.     Comments: Black stool Musculoskeletal:     Cervical back: Normal range of motion and neck supple.  Skin:    General: Skin is warm.     Capillary Refill: Capillary refill takes less than 2 seconds.  Neurological:     Mental Status: She is alert. Mental status is at baseline. She is disoriented.  Psychiatric:        Mood and Affect: Mood normal.     ED Results / Procedures / Treatments   Labs (all labs ordered are listed, but only abnormal results are displayed) Labs Reviewed  COMPREHENSIVE METABOLIC PANEL - Abnormal; Notable for the following components:      Result Value   Glucose, Bld 127 (*)    BUN 42 (*)    Creatinine, Ser 1.01 (*)    Total Protein 5.9 (*)    Albumin 2.6 (*)    Alkaline Phosphatase 133 (*)    GFR, Estimated 53 (*)    All other components within normal limits  CBC - Abnormal; Notable for the following components:   WBC 11.5 (*)    RBC 3.14 (*)    Hemoglobin 7.6 (*)    HCT 27.0 (*)    MCH 24.2 (*)    MCHC 28.1 (*)    RDW 16.9 (*)    Platelets 408 (*)    All other components within normal limits  POC OCCULT BLOOD, ED - Abnormal; Notable for the following components:   Fecal Occult Bld POSITIVE (*)    All other components within normal limits  URINE CULTURE  RESP PANEL BY RT-PCR (FLU A&B, COVID) ARPGX2  LIPASE, BLOOD  URINALYSIS, ROUTINE W REFLEX  MICROSCOPIC  PROTIME-INR  MAGNESIUM  TSH  TYPE AND SCREEN  PREPARE RBC (CROSSMATCH)  ABO/RH    EKG EKG Interpretation  Date/Time:  Thursday September 12 2020 08:53:27 EST Ventricular Rate:  105 PR Interval:    QRS Duration:  74 QT Interval:  320 QTC Calculation: 422 R Axis:   92 Text Interpretation: Atrial fibrillation with rapid ventricular response Rightward axis Nonspecific T wave abnormality Abnormal ECG No old tracing to compare Confirmed by Jacalyn Lefevre [phone]) on 09/12/2020 12:28:12 PM   Radiology No results found.  Procedures Procedures (including critical care time)  Medications Ordered in ED Medications  pantoprazole (PROTONIX) 80 mg in sodium chloride 0.9 % 100 mL IVPB (has no administration in time range)  pantoprazole (PROTONIX) 80 mg in sodium chloride 0.9 % 100 mL (0.8 mg/mL) infusion (has no administration in time range)  0.9 %  sodium chloride infusion (Manually program via Guardrails IV Fluids) (has no administration in time range)  diltiazem (CARDIZEM) 1 mg/mL load via infusion 20 mg (has no administration in time range)    And  diltiazem (CARDIZEM) 125 mg in dextrose 5% 125 mL (1 mg/mL) infusion (has no administration in time range)  sodium chloride 0.9 % bolus 500 mL (500 mLs Intravenous New Bag/Given 09/12/20 1252)    ED Course  I have reviewed the triage vital signs and the nursing notes.  Pertinent labs & imaging results that were available during my care of the patient were reviewed by me and considered in my medical decision making (see chart for details).    MDM Rules/Calculators/A&P                          Pt's EKG looks like afib.  We have no old EKGs on her.  CHADVASC 6, but she is not a candidate for anticoagulation.  Pt started on cardizem for rate control.  She has a hgb of 7.6.  No old hemoglobins to compare.  I spoke with pt's POA (son-in-law Beola Cord).  He does not know any old hemoglobins or GI doctors that pt has seen.  He is  interested in pt receiving blood and having a GI consult.    Pt d/w Jennye Moccasin ([facility]).  She will see pt.  PCP listed on SNF forms is for Dr. Broadus John who is in [place] where she is from and where her son lives.  Unassigned called for admission.  I spoke with IMTS.  CRITICAL CARE Performed by: Jacalyn Lefevre   Total critical care time: 30 minutes  Critical care time was exclusive of separately billable procedures and treating other patients.  Critical care was necessary to treat or prevent imminent or life-threatening deterioration.  Critical care was time spent personally by me on the following activities: development of treatment plan with patient and/or surrogate as well as nursing, discussions with consultants, evaluation of patient's response to treatment, examination of patient, obtaining history from patient or surrogate, ordering and performing treatments and interventions, ordering and review of laboratory studies, ordering and review of radiographic studies, pulse oximetry and re-evaluation of patient's condition.  Final Clinical Impression(s) / ED Diagnoses Final diagnoses:  Symptomatic anemia  Acute GI bleeding  Atrial fibrillation with RVR (HCC)    Rx / DC Orders ED Discharge Orders    None       Jacalyn Lefevre, MD 09/12/20 1317

## 2020-09-12 NOTE — Progress Notes (Signed)
Pt arrived to 6E room 16 at 1645. Patient is alert only to self, teary eye and emotional. This RN and RN Neysa Bonito performed skin assessment. Patient has a stage 1 on sacrum and stage 1 on left hip and bilateral heels were red and unblanchable. Mepilex foams were placed over these areas. Patient placed on low bed  With bed alarm on. Call lights within reach.

## 2020-09-12 NOTE — Consult Note (Signed)
[facility] Consult: 12:47 PM 09/12/2020  LOS: 0 days    Referring Provider: Dr Gilford Raid  Primary Care Physician:  Cleatis Polka NP of Drs on call.   Primary Gastroenterologist:  unassigned  DtrTresa Schaefer [phone].      Reason for Consultation:  Anemia.  FOBT +.     HPI: Maureen Schaefer is a 86 y.o. female.  PMH moderate to severe dementia.  CAD.  OSA.  Htn.  Polyneuropathy.  Hypothythyroidism.  bil hip replacements.   Family not aware if pt ever had colonoscopy, EGD or issues w anemia, gi bleeding.   Family moved pt from [place] to [facility] memory care unit in August 2021.    Anorexia since late summer 20121, primarily consuming liquids, boost etc. ~ 15 # wt loss over4 to 5 months. No abd pain, no n/v, no altered bowel habits. Frequent falls so now mostly mobile in her wheelchair.    Yesterday c/o sternal and L arm pain.  Chest pain better after she leaned back and sat up straight (tends to slouch forward per dtr).  No SOB. Nausea and non-bloody emesis after breakfast this AM Transported from [email or web address].to ED DRE w black FOBT + stool.   Hgb 7.6.  MCV 86.   ALK phos 133, Albumin 2.6 o/w normal LFTs  Today patient denies chest or musculoskeletal pain.  Is not recalling the nausea vomiting this morning but denies nausea currently.  Asking for water which I brought to her and she drank without obvious dysphagia or regurgitation.  Past Medical History:  Diagnosis Date  . CAD (coronary artery disease)   . Dementia ([place])   . Hypertension   . Mitral valve prolapse   . Polyneuropathy   . Sleep apnea   . TIA (transient ischemic attack)     History reviewed. No pertinent surgical history.  Prior to Admission medications   Medication Sig Start Date End Date Taking? Authorizing Provider   acetaminophen (TYLENOL) 500 MG tablet Take 1,000 mg by mouth in the morning, at noon, and at bedtime.    [provider]  acetaminophen (TYLENOL) 500 MG tablet Take 2 tablets (1,000 mg total) by mouth every 6 (six) hours as needed for moderate pain. 05/25/20   Charlesetta Shanks, MD  alum & mag hydroxide-simeth [facility]) 200-200-20 MG/5ML suspension Take 30 mLs by mouth every 8 (eight) hours as needed for indigestion or heartburn.    [provider]  ARIPiprazole (ABILIFY) 2 MG tablet Take 2 mg by mouth daily.    [provider]  celecoxib (CELEBREX) 100 MG capsule Take 100 mg by mouth daily.    [provider]  cholecalciferol (VITAMIN D3) 25 MCG (1000 UNIT) tablet Take 1,000 Units by mouth daily.    [provider]  donepezil (ARICEPT) 10 MG tablet Take 10 mg by mouth at bedtime.    [provider]  Ensure Plus (ENSURE PLUS) LIQD Take 237 mLs by mouth daily.    [provider]  escitalopram (LEXAPRO) 20 MG tablet Take 20 mg by mouth daily.  07/31/20   [provider]  estradiol (ESTRACE) 0.1 MG/GM vaginal cream Place 1 Applicatorful vaginally 3 (three) times a week.    [provider]  levothyroxine (SYNTHROID) 50 MCG tablet Take 50 mcg by mouth daily before breakfast.    [provider]  loperamide (IMODIUM A-D) 2 MG tablet Take 2 mg by mouth daily as needed for diarrhea or loose stools.    [provider]  magnesium hydroxide (MILK OF MAGNESIA) 400 MG/5ML suspension Take 30 mLs by mouth daily as needed (diarrhea).    [provider]  metoprolol tartrate (LOPRESSOR) 25 MG tablet Take 25 mg by mouth daily.    [provider]  polyethylene glycol (MIRALAX / GLYCOLAX) 17 g packet Take 17 g by mouth daily.    [provider]  polyvinyl alcohol (LIQUIFILM TEARS) 1.4 % ophthalmic solution Place 2 drops into both eyes in the morning, at noon, and at bedtime.    [provider]  rosuvastatin (CRESTOR) 5 MG tablet Take 5 mg by mouth as directed. Take on MWF    [provider]  triamcinolone (NASACORT) 55 MCG/ACT AERO nasal inhaler Place 2 sprays into the nose daily.    [provider]    Scheduled Meds: . sodium chloride   Intravenous Once   Infusions: . pantoprozole (PROTONIX) infusion    . pantoprazole (PROTONIX) 80 mg IVPB    . sodium chloride     PRN Meds:    Allergies as of 09/12/2020 - Review Complete 09/12/2020  Allergen Reaction Noted  . Coconut flavor  05/01/2020    No family history on file.  Social History   Socioeconomic History  . Marital status: Widowed    Spouse name: Not on file  . Number of children: Not on file  . Years of education: Not on file  . Highest education level: Not on file  Occupational History  . Not on file  Tobacco Use  . Smoking status: Unknown If Ever Smoked  . Smokeless tobacco: Not on file  Substance and Sexual Activity  . Alcohol use: Not Currently  . Drug use: Never  . Sexual activity: Not on file  Other Topics Concern  . Not on file  Social History Narrative  . Not on file   Social Determinants of Health   Financial Resource Strain: Not on file  Food Insecurity: Not on file  Transportation Needs: Not on file  Physical Activity: Not on file  Stress: Not on file  Social Connections: Not on file  Intimate Partner Violence: Not on file    REVIEW OF SYSTEMS: Constitutional: Frail, frequent falls.  No new fatigue ENT:  No nose bleeds Pulm: No reported issues with breathing or cough CV: Sternal discomfort yesterday as per HPI, resolved with repositioning no palpitations, no LE edema.  GU:  No hematuria, no frequency GI: See HPI Heme: Patient's daughter Maureen Schaefer not aware of any previous issues with anemia or bleeding Transfusions: Family not aware of previous transfusions Neuro:  No headaches, no peripheral tingling or numbness.  No syncope, no seizures Derm:  No itching, no  rash or sores.  Endocrine:  No sweats or chills.  No polyuria or dysuria Immunization: Records of 04/2020 indicate she had had Covid vaccination. Travel:  None beyond local counties in last few months.    PHYSICAL EXAM: Vital signs in last 24 hours: Vitals:   09/12/20 0849 09/12/20 1141  BP: (!) 118/93 (!) 141/80  Pulse: 90 (!) 112  Resp:  18 16  Temp: 97.6 F (36.4 C)   SpO2: 95% 100%   Wt Readings from Last 3 Encounters:  08/01/20 60 kg    General: Frail, aged, comfortable.  Alert Head: No signs of head trauma.  No facial asymmetry. Eyes: Conjunctiva pale.  No scleral icterus. Ears: Seems slightly HOH Nose: No discharge or congestion Mouth: Moist, pink, clear oral mucosa.  Tongue midline.  Good dental repair. Neck: No JVD, masses, thyromegaly Lungs: Clear bilaterally, no labored breathing, no cough Heart: RRR.  No MRG.  S1, S2 present Abdomen: Soft, nondistended.  Patient voiced that her belly hurt when I press on it but there was no physical reaction in terms of guarding or rebound.  Bowel sounds normal, active..   Rectal: Dr. Elyse Hsu exam revealed black stool FOBT positive Musc/Skeltl: Spinal kyphosis.  Arthritic changes in the hands.  Osteopenic/osteoporotic appearing Extremities: No CCE.   Neurologic: When asked her name she said Gennette Pac Cox.  She could not tell me why her records carry the last name of Goga.  She was able to list the names of 2 of her children but then just said she had many children.  Can tell me she was born and raised in [place].  Not oriented to place or time.  Her answers and responses to commands are appropriate. Skin: No rash, no sores, no significant purpura or bruising.  No telangiectasia.  Healed scars on her shins bilaterally Nodes: No cervical adenopathy Psych: Affect flat/bland.  Calm.  Not agitated.  Poverty of speech  Intake/Output from previous day: No intake/output data recorded. Intake/Output this shift: No  intake/output data recorded.  LAB RESULTS: Recent Labs    09/12/20 0903  WBC 11.5*  HGB 7.6*  HCT 27.0*  PLT 408*   BMET Lab Results  Component Value Date   NA 139 09/12/2020   K 4.5 09/12/2020   CL 105 09/12/2020   CO2 23 09/12/2020   GLUCOSE 127 (H) 09/12/2020   BUN 42 (H) 09/12/2020   CREATININE 1.01 (H) 09/12/2020   CALCIUM 10.0 09/12/2020   LFT Recent Labs    09/12/20 0903  PROT 5.9*  ALBUMIN 2.6*  AST 25  ALT 20  ALKPHOS 133*  BILITOT 0.7   PT/INR No results found for: INR, PROTIME Hepatitis Panel No results for input(s): HEPBSAG, HCVAB, HEPAIGM, HEPBIGM in the last 72 hours. C-Diff No components found for: CDIFF Lipase     Component Value Date/Time   LIPASE 28 09/12/2020 0903    Drugs of Abuse  No results found for: LABOPIA, COCAINSCRNUR, LABBENZ, AMPHETMU, THCU, LABBARB   RADIOLOGY STUDIES: No results found.    IMPRESSION:   *   Normocytic anemia.  No prior labs for comp.    *   FOBT + black stool.  Celebrex but no ASA, blood thinners or platelet disrupting meds, PPI/H2B on med list. R/o ulcer, gastritis, neoplasm  *   Sternal and L UE pain yesterday. Need to obtain cardiac enzymes. CAD listed on PMH but no details.    *   Hypothyroidism.  On Synthroid.     *   Elevated alkaline phosphatase, normal T bili and transaminases.  Normal INR    PLAN:     *   Need to assay TSH, cardiac trops.  Ordered.  *   Eventual EGD.  Dtr Maureen Schaefer confirms that she and her sibs are ok w proceeding w endoscopy.    *   I switched the orders for  the Protonix bolus and infusion to Protonix 40 mg IV bid. She was able to sip about 4 or 6 ounces of cold water through a straw while I was in the room, I am going to allow her clear liquids as tolerated.   Azucena Freed  09/12/2020, 12:47 PM Phone [phone]

## 2020-09-12 NOTE — H&P (Signed)
Date: 09/12/2020               Patient Name:  Maureen Schaefer MRN: [id number]  DOB: 1926/11/17 Age / Sex: 86 y.o., female   PCP: Pcp, No         Medical Service: Internal Medicine Teaching Service         Attending Physician: Dr. Jacalyn Lefevre, MD    First Contact: Dr. Alphonzo Severance Pager: [phone]  Second Contact: Dr. Dolan Amen Pager: [phone]       After Hours (After 5p/  First Contact Pager: [phone]  weekends / holidays): Second Contact Pager: [phone]   Chief Complaint: nausea and vomiting, malaise  History of Present Illness:   Maureen Schaefer is a 86 year old woman with history of severe dementia, CAD, HTN, TIA, mitral valve prolapse, sleep apnea, polyneuropathy, PVCs who presents to the [facility] ED from her LTAC memory unit with nausea, vomiting, and malaise.  Patient oriented to self only, unable to contribute to this history.  History obtained from patient's LTAC nurse, daughter and son-in-law, and ED provider.  Patient's LTAC nurse reports the patient was in her usual state of health until this morning when she reportedly stated she felt like she was going to pass out, had an episode of emesis, head tilted down, and her whole body began to shake. EMS was called and brought the patient to the ED.  Per the LTAC nurse, when the patient first moved to the facility for the first 1-2 weeks she was talkative and able to ambulate with a walker. However since then she cries a significant amount of the day, gets around in a wheelchair only, says she feels like she is going to die, spends much of her day in bed. No recent fevers or chills, complaints of chest pain or shortness of breath. Reports the patient "barely eats and when she does drink a Boost, it comes right through her."   Per the daughter, Johnny Bridge, and her husband Nadine Counts, who is HCPOA, they are unaware if the patient has any history of heart problems or GI bleeding. Unknown colonoscopy history. the patient is originally  from [place]. She and her husband were living in an assistive care facility until he passed away in 2021/01/13after which the patient had worsening of her dementia and moved into a memory care facility in [place]. The family moved her to the LTAC in [place] to be closer to another daughter, Clint Lipps. Johnny Bridge states the family is aware the patient is nearing the end of her life and has had physical and mental deterioration over the last few months. States the patient is a DNR.  ED Course: Patient afebrile, intermittently tachy with HR 50s to 100s, BP 130s-140s/60s-90s, maintaining saturations on room air. RPP negative. EDP witnessed black stools in diaper, POC occult blood positive. CBC notable for normocytic anemia with hemoglobin 7.6 and MCV 86 (unknown baseline), platelets 408. CMP with BUN/Cr 42/1.01 (unknown baseline), hypoalbuminemia (2.6). EKG demonstrated atrial fibrillation with RVR. GI was consulted.   Meds:  Current Meds  Medication Sig  . acetaminophen (TYLENOL) 500 MG tablet Take 2 tablets (1,000 mg total) by mouth every 6 (six) hours as needed for moderate pain. (Patient taking differently: Take 1,000 mg by mouth every 8 (eight) hours as needed for moderate pain.)  . alum & mag hydroxide-simeth (MAALOX/MYLANTA) 200-200-20 MG/5ML suspension Take 30 mLs by mouth every 8 (eight) hours as needed for indigestion or heartburn.  . ARIPiprazole (ABILIFY) 2 MG tablet  Take 2 mg by mouth daily.  . celecoxib (CELEBREX) 100 MG capsule Take 100 mg by mouth daily.  . cholecalciferol (VITAMIN D3) 25 MCG (1000 UNIT) tablet Take 1,000 Units by mouth daily.  Marland Kitchen donepezil (ARICEPT) 10 MG tablet Take 10 mg by mouth at bedtime.  Marland Kitchen escitalopram (LEXAPRO) 10 MG tablet Take 10 mg by mouth daily.  Marland Kitchen estradiol (ESTRACE) 0.1 MG/GM vaginal cream Place 1 Applicatorful vaginally 3 (three) times a week.  . feeding supplement (BOOST HIGH PROTEIN) LIQD Take 1 Container by mouth 3 (three) times daily between meals.  chocolate  . levothyroxine (SYNTHROID) 50 MCG tablet Take 50 mcg by mouth daily before breakfast.  . loperamide (IMODIUM A-D) 2 MG tablet Take 2 mg by mouth daily as needed for diarrhea or loose stools.  . magnesium hydroxide (MILK OF MAGNESIA) 400 MG/5ML suspension Take 30 mLs by mouth daily as needed for mild constipation.  . metoprolol tartrate (LOPRESSOR) 25 MG tablet Take 25 mg by mouth daily.  . mirtazapine (REMERON) 7.5 MG tablet Take 7.5 mg by mouth at bedtime.  . nitrofurantoin (MACRODANTIN) 100 MG capsule Take 100 mg by mouth 2 (two) times daily.  . ondansetron (ZOFRAN-ODT) 4 MG disintegrating tablet Take 4 mg by mouth every 6 (six) hours as needed for nausea or vomiting.  . polyethylene glycol (MIRALAX / GLYCOLAX) 17 g packet Take 17 g by mouth daily.  . polyvinyl alcohol (LIQUIFILM TEARS) 1.4 % ophthalmic solution Place 2 drops into both eyes in the morning, at noon, and at bedtime.  . rosuvastatin (CRESTOR) 5 MG tablet Take 5 mg by mouth as directed. Take 1 tablet by mouth on Monday, Wednesday and friday  . triamcinolone (NASACORT) 55 MCG/ACT AERO nasal inhaler Place 2 sprays into the nose daily.  . [DISCONTINUED] acetaminophen (TYLENOL) 500 MG tablet Take 1,000 mg by mouth in the morning, at noon, and at bedtime.    Social History: Moved by family from memory care unit in [place] to memory care unit in [place] in August 2021 to be closer to one of her daughters, Clint Lipps. Husband, Merlyn Albert, passed away in Sep 16, 2019.   Family History: non-contributory  Allergies: Allergies as of 09/12/2020 - Review Complete 09/12/2020  Allergen Reaction Noted  . Coconut flavor  05/01/2020   Past Medical History:  Diagnosis Date  . CAD (coronary artery disease)   . Dementia ([facility])   . Hypertension   . Mitral valve prolapse   . Polyneuropathy   . Sleep apnea   . TIA (transient ischemic attack)     Review of Systems: A complete ROS was negative except as per HPI.   Physical Exam: Blood  pressure (!) 155/83, pulse (!) 49, temperature 97.9 F (36.6 C), temperature source Oral, resp. rate 16, SpO2 99 %. Constitutional: Very frail and tired-appearing woman slumped over slightly in hospital bed, in no acute distress, very hard of hearing HENT: normocephalic atraumatic, mucous membranes moist, sublingual pallor Eyes: conjunctival pallor Neck: supple Cardiovascular: irregularly irregular rhythm, no m/r/g, trace pitting lower extremity edema Pulmonary/Chest: normal work of breathing on room air, lungs clear to auscultation bilaterally Abdominal: soft, non-tender, non-distended Neurological: alert & oriented to self only, moves all extremities spontaneously, follows commands Skin: warm and dry   Labs: CBC    Component Value Date/Time   WBC 11.5 (H) 09/12/2020 0903   RBC 3.14 (L) 09/12/2020 0903   HGB 7.6 (L) 09/12/2020 0903   HCT 27.0 (L) 09/12/2020 0903   PLT 408 (H) 09/12/2020 8841  MCV 86.0 09/12/2020 0903   MCH 24.2 (L) 09/12/2020 0903   MCHC 28.1 (L) 09/12/2020 0903   RDW 16.9 (H) 09/12/2020 0903     CMP     Component Value Date/Time   NA 139 09/12/2020 0903   K 4.5 09/12/2020 0903   CL 105 09/12/2020 0903   CO2 23 09/12/2020 0903   GLUCOSE 127 (H) 09/12/2020 0903   BUN 42 (H) 09/12/2020 0903   CREATININE 1.01 (H) 09/12/2020 0903   CALCIUM 10.0 09/12/2020 0903   PROT 5.9 (L) 09/12/2020 0903   ALBUMIN 2.6 (L) 09/12/2020 0903   AST 25 09/12/2020 0903   ALT 20 09/12/2020 0903   ALKPHOS 133 (H) 09/12/2020 0903   BILITOT 0.7 09/12/2020 0903   GFRNONAA 53 (L) 09/12/2020 0903    Imaging: none EKG: personally reviewed my interpretation is atrial fibrillation with RVR  Assessment & Plan by Problem: Active Problems:   GI bleeding   Ms. Lamarca is a 86 year old woman with history of severe dementia, CAD, HTN, HLD, TIA, hypothyroidism, mitral valve prolapse, sleep apnea, polyneuropathy, PVCs who presents to the [facility] ED from her LTAC memory unit with  nausea, vomiting, and malaise found to anemia concerning for GI bleed and new-onset atrial fibrillation.  Symptomatic normocytic anemia Hemoglobin 7.6 on admission with unknown baseline, HDS, no prior labs for comparison. FOBT positive and with black stool noted in diaper by EDP. Celebrex on patient's medication list but no aspirin or blood thinners. Per the family, unsure if/when patient had colonoscopy, no history of cancer. High suspicion for GI bleed. Differential includes ulcer, gastritis, neoplasm. GI consulted by EDP who evaluated patient and recommend RBC transfusion, IV PPI, NPO after midnight for possible upper endoscopy this admission pending family's wishes. - GI following, appreciate recs - Transfuse 1 unit RBCs - f/u post-infusion H&H - AM CBC - pantoprazole 40 mg IV q12h for GI prophylaxis - possible EGD tomorrow pending discussion with family  New-onset atrial fibrillation with RVR Patient noted to be in Afib with RVR on admission. No documented history.  - diltiazem gtt - trend troponins as above - TSH - cardiac telemetry  Sternal and LUE pain History of CAD Per GI, patient reportedly had sternal and LUE pain yesterday. Without more information regarding her cardiac history, will trend trops. - Trend troponins  Hypothyroidism - continue home levothyroxine 50 mg daily - repeat TSH  HTN HLD - continue home metoprolol 25 mg daily - continue home rosuvastatin 5 mg MWF  Severe dementia Continue home medications as below. - donepezil 10 mg nightly - aripiprazole 2 mg daily - delirium precautions - escitalopram 10 mg daily - mirtazapine 7.5 mg nightly - delirium precautions  Goals of care Family states they are aware patient is nearing the end of her life. Has had significant physical and mental deterioration over the last year.   Diet: Thin liquids, NPO at midnight for possible EGD tomorrow VTE: SCDs IVF: None Code: DNR  Prior to Admission Living  Arrangement: LTAC, memory care unit Anticipated Discharge Location: SNF   Dispo: Admit patient to Inpatient with expected length of stay greater than 2 midnights.  Signed: Alphonzo Severance, MD PGY-1 Internal Medicine Teaching Service Pager: [phone] 09/12/2020

## 2020-09-12 NOTE — ED Triage Notes (Signed)
Pt from [facility] with ems for c.o n/v this morning after eating breakfast. Hx of dementia. Pt alert, nad noted

## 2020-09-12 NOTE — ED Notes (Signed)
Attempted to give report and was told the nurse will call me back. 

## 2020-09-12 NOTE — Progress Notes (Signed)
Patient is unable to complete H&P at this time due to disorientation.   Bari Edward, RN

## 2020-09-13 DIAGNOSIS — M25512 Pain in left shoulder: Secondary | ICD-10-CM

## 2020-09-13 DIAGNOSIS — I2510 Atherosclerotic heart disease of native coronary artery without angina pectoris: Secondary | ICD-10-CM

## 2020-09-13 DIAGNOSIS — F0390 Unspecified dementia without behavioral disturbance: Secondary | ICD-10-CM

## 2020-09-13 DIAGNOSIS — I10 Essential (primary) hypertension: Secondary | ICD-10-CM

## 2020-09-13 DIAGNOSIS — E785 Hyperlipidemia, unspecified: Secondary | ICD-10-CM

## 2020-09-13 DIAGNOSIS — I4891 Unspecified atrial fibrillation: Secondary | ICD-10-CM

## 2020-09-13 DIAGNOSIS — E039 Hypothyroidism, unspecified: Secondary | ICD-10-CM

## 2020-09-13 DIAGNOSIS — L8990 Pressure ulcer of unspecified site, unspecified stage: Secondary | ICD-10-CM | POA: Insufficient documentation

## 2020-09-13 LAB — TYPE AND SCREEN
ABO/RH(D): A POS
Antibody Screen: NEGATIVE
Unit division: 0
Unit division: 0

## 2020-09-13 LAB — BPAM RBC
Blood Product Expiration Date: 202112292359
Blood Product Expiration Date: 202201202359
ISSUE DATE / TIME: 202112231402
ISSUE DATE / TIME: 202112231702
Unit Type and Rh: 6200
Unit Type and Rh: 6200

## 2020-09-13 LAB — CBC
HCT: 35.1 % — ABNORMAL LOW (ref 36.0–46.0)
Hemoglobin: 11.4 g/dL — ABNORMAL LOW (ref 12.0–15.0)
MCH: 27.1 pg (ref 26.0–34.0)
MCHC: 32.5 g/dL (ref 30.0–36.0)
MCV: 83.4 fL (ref 80.0–100.0)
Platelets: 282 10*3/uL (ref 150–400)
RBC: 4.21 MIL/uL (ref 3.87–5.11)
RDW: 16.3 % — ABNORMAL HIGH (ref 11.5–15.5)
WBC: 10.9 10*3/uL — ABNORMAL HIGH (ref 4.0–10.5)
nRBC: 0 % (ref 0.0–0.2)

## 2020-09-13 LAB — BASIC METABOLIC PANEL
Anion gap: 9 (ref 5–15)
BUN: 35 mg/dL — ABNORMAL HIGH (ref 8–23)
CO2: 22 mmol/L (ref 22–32)
Calcium: 9.6 mg/dL (ref 8.9–10.3)
Chloride: 108 mmol/L (ref 98–111)
Creatinine, Ser: 0.71 mg/dL (ref 0.44–1.00)
GFR, Estimated: >60 mL/min (ref >60)
Glucose, Bld: 79 mg/dL (ref 70–99)
Potassium: 4.7 mmol/L (ref 3.5–5.1)
Sodium: 139 mmol/L (ref 135–145)

## 2020-09-13 LAB — ABO/RH: ABO/RH(D): A POS

## 2020-09-13 MED ORDER — PROSOURCE PLUS PO LIQD
30.0000 mL | Freq: Two times a day (BID) | ORAL | Status: DC
  Administered 2020-09-14 – 2020-09-17 (×4): 30 mL via ORAL
  Filled 2020-09-13 (×9): qty 30

## 2020-09-13 MED ORDER — ADULT MULTIVITAMIN W/MINERALS CH
1.0000 | ORAL_TABLET | Freq: Every day | ORAL | Status: DC
  Administered 2020-09-13 – 2020-09-17 (×2): 1 via ORAL
  Filled 2020-09-13 (×4): qty 1

## 2020-09-13 MED ORDER — ENSURE ENLIVE PO LIQD
237.0000 mL | Freq: Two times a day (BID) | ORAL | Status: DC
  Administered 2020-09-14 – 2020-09-17 (×4): 237 mL via ORAL

## 2020-09-13 NOTE — Progress Notes (Signed)
Initial Nutrition Assessment  DOCUMENTATION CODES:   Non-severe (moderate) malnutrition in context of chronic illness  INTERVENTION:   -Ensure Enlive po BID, each supplement provides 350 kcal and 20 grams of protein -30 ml Prosource Plus BID, each supplement provides 100 kcals and 15 grams protein -Magic cup TID with meals, each supplement provides 290 kcal and 9 grams of protein -MVI with minerals daily  NUTRITION DIAGNOSIS:   Moderate Malnutrition related to chronic illness (dementia) as evidenced by mild fat depletion,moderate fat depletion,mild muscle depletion,moderate muscle depletion.  GOAL:   Patient will meet greater than or equal to 90% of their needs  MONITOR:   PO intake,Supplement acceptance,Diet advancement,Labs,Weight trends,Skin,I & O's  REASON FOR ASSESSMENT:   Consult Assessment of nutrition requirement/status,Poor PO  ASSESSMENT:   Maureen Schaefer is a 86 year old woman with history of severe dementia, CAD, HTN, HLD, TIA, hypothyroidism, mitral valve prolapse, sleep apnea, polyneuropathy, PVCs who presents to the [facility] ED from her LTAC memory unit with nausea, vomiting, and malaise found to anemia concerning for GI bleed and new-onset atrial fibrillation.  Pt admitted with symptomatic normocytic anemia.   Reviewed I/O's: +1.5 L x 24 hours  Per GI notes, EGD was offered for today, however, pt daughter declined at this time, however, may attempt again in a few days when pt is more medically stable.   Pt sleeping in bed at time of visit. She is not alert enough to answer questions.   Per H&P, pt has experienced a general decline in health over the past 3 months, including poor oral intake.   No current wt has been obtained. Last documented wt was 60 kg on 08/01/20. Unable to obtain wt from bedscale.   Palliative care consult being considered.   Medications reviewed and include remeron and cardizem.   Labs reviewed.   NUTRITION - FOCUSED PHYSICAL  EXAM:  Flowsheet Row Most Recent Value  Orbital Region Mild depletion  Upper Arm Region Mild depletion  Thoracic and Lumbar Region Mild depletion  Buccal Region Moderate depletion  Temple Region Moderate depletion  Clavicle Bone Region Moderate depletion  Clavicle and Acromion Bone Region Moderate depletion  Scapular Bone Region Moderate depletion  Dorsal Hand Unable to assess  Patellar Region Mild depletion  Anterior Thigh Region Mild depletion  Posterior Calf Region Mild depletion  Edema (RD Assessment) None  Hair Reviewed  Eyes Reviewed  Mouth Reviewed  Skin Reviewed  Nails Reviewed       Diet Order:   Diet Order            Diet full liquid Room service appropriate? Yes; Fluid consistency: Thin  Diet effective now                 EDUCATION NEEDS:   Not appropriate for education at this time  Skin:  Skin Assessment: Skin Integrity Issues: Skin Integrity Issues:: Stage I Stage I: sacrum and lt hip  Last BM:  Unknown  Height:   Ht Readings from Last 1 Encounters:  08/01/20 5\' 6"  (1.676 m)    Weight:   Wt Readings from Last 1 Encounters:  08/01/20 60 kg    Ideal Body Weight:  59.1 kg  BMI:  There is no height or weight on file to calculate BMI.  Estimated Nutritional Needs:   Kcal:  1550-1750  Protein:  75-90 grams  Fluid:  > 1.5 L    13/11/21, RD, LDN, CDCES Registered Dietitian II Certified Diabetes Care and Education Specialist Please refer to  AMION for RD and/or RD on-call/weekend/after hours pager

## 2020-09-13 NOTE — Evaluation (Signed)
Clinical/Bedside Swallow Evaluation Patient Details  Name: Ericca Labra MRN: [id number] Date of Birth: 03-19-1927  Today's Date: 09/13/2020 Time: SLP Start Time (ACUTE ONLY): 1457 SLP Stop Time (ACUTE ONLY): 1507 SLP Time Calculation (min) (ACUTE ONLY): 10 min  Past Medical History:  Past Medical History:  Diagnosis Date  . CAD (coronary artery disease)   . Dementia (HCC)   . Hypertension   . Mitral valve prolapse   . Polyneuropathy   . Sleep apnea   . TIA (transient ischemic attack)    Past Surgical History: History reviewed. No pertinent surgical history. HPI:  Pt is a 86 yo female presenting with N/V and malaise, found to have anemia concerning for GI bleed and new onset afib. Per chart, appetite is poor PTA with pt primarily consuming liquids. PMH includes: severe dementia, CAD, HTN, TIA, mitral valve prolapse, OSA, polyneuropathy   Assessment / Plan / Recommendation Clinical Impression  Pt appears to have a cognitively-based dysphagia that is consistent with her h/o dementia. She has reduced awareness of boluses, but once she gets started drinking thin liquids, she does so with no overt s/s of aspiration. She appears to swallow swiftly and without any overt difficulty. When offered purees, she has less awareness and does not take anything into her mouth. Recommend continuing with full liquid diet. Will f/u briefly to try to assess with at least pureed solids, although note that she was consuming primarily liquids PTA. SLP Visit Diagnosis: Dysphagia, oral phase (R13.11)    Aspiration Risk  Mild aspiration risk;Risk for inadequate nutrition/hydration    Diet Recommendation Thin liquid (full liquid diet)   Liquid Administration via: Cup;Straw Medication Administration: Crushed with puree (may need to consider liquid or alternative means if not accepting purees orally)    Other  Recommendations Oral Care Recommendations: Oral care QID Other Recommendations: Have oral suction  available   Follow up Recommendations Skilled Nursing facility      Frequency and Duration min 2x/week  1 week       Prognosis Prognosis for Safe Diet Advancement: Guarded Barriers to Reach Goals: Cognitive deficits;Time post onset      Swallow Study   General HPI: Pt is a 86 yo female presenting with N/V and malaise, found to have anemia concerning for GI bleed and new onset afib. Per chart, appetite is poor PTA with pt primarily consuming liquids. PMH includes: severe dementia, CAD, HTN, TIA, mitral valve prolapse, OSA, polyneuropathy Type of Study: Bedside Swallow Evaluation Previous Swallow Assessment: none in chart Diet Prior to this Study: Thin liquids (full liquid diet) Temperature Spikes Noted: No Respiratory Status: Room air History of Recent Intubation: No Behavior/Cognition: Alert;Cooperative;Confused;Requires cueing Oral Care Completed by SLP: No Oral Cavity - Dentition:  (difficult to see in her mouth) Self-Feeding Abilities: Total assist Patient Positioning: Postural control adequate for testing Baseline Vocal Quality: Normal    Oral/Motor/Sensory Function Overall Oral Motor/Sensory Function:  (limited command following but appears to be symmetrical)   Ice Chips Ice chips: Not tested   Thin Liquid Thin Liquid: Within functional limits Presentation: Straw    Nectar Thick Nectar Thick Liquid: Not tested   Honey Thick Honey Thick Liquid: Not tested   Puree Puree: Not tested   Solid     Solid: Not tested      Mahala Menghini., M.A. CCC-SLP Acute Rehabilitation Services Pager [phone] Office [phone]  09/13/2020,3:25 PM

## 2020-09-13 NOTE — Progress Notes (Signed)
Date: 09/13/2020  Patient name: Maureen Schaefer  Medical record number: [id number]  Date of birth: Dec 12, 1926   I have seen and evaluated Maureen Schaefer and discussed their care with the Residency Team.  In brief, patient is a 86 year old female with a past medical history of severe dementia, CAD, hypertension, TIA, mitral valve prolapse, OSA, polyneuropathy who presented to the ED with nausea and vomiting as well as malaise.  History obtained from chart as patient is unable to provide a history at this time.  Per chart, patient felt like she was going to pass out yesterday morning and had an episode of emesis followed by shaking of her body.  EMS was called and patient was brought to the ED for further evaluation.  Per chart, patient has a poor appetite and barely eats.  When patient first moved to her current long-term care facility she was talkative and able to ambulate with a walker but she has been gradually deteriorating and now spends a significant monitor time in her bed and appears depressed.  Today, patient is asleep but arousable to voice and is able to answer some simple questions with yes or no.  She denies any pain currently.  PMHx, Fam Hx, and/or Soc Hx : As per resident admit note  Vitals:   09/13/20 0011 09/13/20 0424  BP: 126/62 (!) 141/81  Pulse: 68 63  Resp: 14 19  Temp: 98.8 F (37.1 C) 98 F (36.7 C)  SpO2: 93% 96%   General: Asleep but easily arousable, NAD CVS: Regular rate and rhythm, normal heart sounds Lungs: CTA bilaterally Abdomen: Soft, nontender, nondistended, normoactive bowel sounds Extremities: No edema noted, nontender to palpation Psych: Depressed affect HEENT: Normocephalic, atraumatic Skin: Warm and dry Neuro: Oriented to self  Assessment and Plan: I have seen and evaluated the patient as outlined above. I agree with the formulated Assessment and Plan as detailed in the residents' note, with the following changes:    1.  Symptomatic anemia  likely secondary to blood loss: -Patient presented to the ED with near syncope and was found to have a hemoglobin of 7.6 likely secondary to acute blood loss.  Patient is taken 1 unit PRBC with subsequent improvement of hemoglobin to 11.4. -It is uncertain if the initial hemoglobin was an error given her increase in hemoglobin to 11.4 with only 1 unit PRBC.  However, given her episode of near syncope I am inclined to think that her original hemoglobin is accurate -GI follow-up and recommendations appreciated.  They suspect the patient likely had an upper GI bleed.  They discussed the case with the patient's daughter who would like to hold off on procedure at this time given her new onset A. fib and lack of active bleeding.  They will consider EGD in a few days once patient is more medically stable. -Continue with PPI 40 mg IV twice daily -No further work-up at this time.  We will monitor CBC closely -Patient and family would likely benefit from goals of care conversation with palliative care.  Patient is currently DNR  2.  New onset A. fib with RVR: -Patient presented to the ED with near syncope and was found to have new onset A. fib with RVR which may have been contributing to her symptoms -Currently patient is rate controlled on diltiazem gtt and appears to have converted to normal sinus rhythm -TSH is within normal limits -We will continue to monitor the patient on telemetry.  Her vitals are currently stable -Patient is not  a candidate for anticoagulation given her ongoing GI bleed. -Troponin was only mildly elevated to 28.  I suspect this is secondary to her A. fib with RVR and anemia resulting in demand ischemia.  Would not pursue aggressive cardiac work-up at this time. -We will continue to monitor closely.  Earl Lagos, MD 12/24/20212:27 PM

## 2020-09-13 NOTE — Progress Notes (Signed)
      Progress Note   Subjective  Patient without any overt bleeding per nursing staff. Her Hgb has been stable. Today in AF with RVR, HR controlled on diltiazem drip   Objective   Vital signs in last 24 hours: Temp:  [97.8 F (36.6 C)-99.3 F (37.4 C)] 98 F (36.7 C) (12/24 0424) Pulse Rate:  [41-118] 63 (12/24 0424) Resp:  [13-28] 19 (12/24 0424) BP: (115-167)/(56-92) 141/81 (12/24 0424) SpO2:  [93 %-99 %] 96 % (12/24 0424) Last BM Date:  (pt unable to verbalize) General:    white female in NAD Abdomen:  Soft, nontender and nondistended.    Intake/Output from previous day: 12/23 0701 - 12/24 0700 In: 1456 [Blood:956; IV Piggyback:500] Out: -  Intake/Output this shift: No intake/output data recorded.  Lab Results: Recent Labs    09/12/20 0903 09/12/20 2311 09/13/20 0316  WBC 11.5*  --  10.9*  HGB 7.6* 10.6* 11.4*  HCT 27.0* 31.4* 35.1*  PLT 408*  --  282   BMET Recent Labs    09/12/20 0903 09/13/20 0316  NA 139 139  K 4.5 4.7  CL 105 108  CO2 23 22  GLUCOSE 127* 79  BUN 42* 35*  CREATININE 1.01* 0.71  CALCIUM 10.0 9.6   LFT Recent Labs    09/12/20 0903  PROT 5.9*  ALBUMIN 2.6*  AST 25  ALT 20  ALKPHOS 133*  BILITOT 0.7   PT/INR Recent Labs    09/12/20 1243  LABPROT 13.9  INR 1.1    Studies/Results: No results found.     Assessment / Plan:    86 y/o female who was admitted with dark stool concerning for melena, drop in Hgb to 7s, heme positive stool. No prior old labs for comparison. She has had some weight loss over the past several months, mostly on liquid diet as outlined. She responded appropriately to RBC transfusion. Troponin mildly elevated and now in AF with RVR, on diltiazem drip which has helped to control HR. BUN elevated on admission and downtrending.   She likely has had an upper GI bleed, stable on PPI at this time. I called her daughter Wynonia Musty, and we discussed the situation for a bit in regards to how aggressive  they wanted [place] to be with her care, and if she would want endoscopy or not. Right now, given AF with RVR and that she is not actively bleeding would hold off today regardless. We either can consider an EGD in a few days when she is medically more stable, or treat empirically with PPI and monitor her course if she does not want invasive procedures done. I discussed risks / benefits of EGD as well as that of forgoing further endoscopic evaluation. Nedra Hai wishes to think about it. If she is medically stable she may want to pursue EGD to rule out high risk pathology and assess her rebleeding risk prior to discharge home, which may be reasonable. I will check on her tomorrow. Call with questions / changes in her status in the interim.  Ileene Patrick, MD [facility] Gastroenterology

## 2020-09-13 NOTE — Progress Notes (Signed)
SLP Cancellation Note  Patient Details Name: Maureen Schaefer MRN: [id number] DOB: 01/14/1927   Cancelled treatment:       Reason Eval/Treat Not Completed: Medical issues which prohibited therapy. Pt NPO pending possible EGD today. Will f/u for swallowing evaluation once able to resume POs.    Mahala Menghini., M.A. CCC-SLP Acute Rehabilitation Services Pager [phone] Office [phone]  09/13/2020, 8:06 AM

## 2020-09-13 NOTE — Progress Notes (Signed)
Pt had a 7-beat-run of V-tach, resting comfortably with no complaints of discomfort. HR sustaining between 60-70 BPM; Cyndie Chime, MD made aware. Will continue to monitor.   Bari Edward, RN

## 2020-09-13 NOTE — Progress Notes (Signed)
HD#1 Subjective:   No acute events overnight. Post-transfusion H&H 10.6.  During evaluation at bedside this morning, the patient is sleeping initially however easily arousable. She denies pain or discomfort. Oriented to self only. No overt bleeding per nursing staff.  Objective:   Vital signs in last 24 hours: Vitals:   09/12/20 1951 09/12/20 2200 09/13/20 0011 09/13/20 0424  BP: 115/79 135/67 126/62 (!) 141/81  Pulse: 99 89 68 63  Resp: 16 13 14 19   Temp: 98.3 F (36.8 C) 99.3 F (37.4 C) 98.8 F (37.1 C) 98 F (36.7 C)  TempSrc: Axillary Axillary Axillary Axillary  SpO2: 96% 95% 93% 96%   Physical Exam Constitutional: Very frail and tired-appearing woman sitting more upright this morning, in no acute distress, very hard of hearing HENT: normocephalic atraumatic, mucous membranes moist, sublingual pallor  Cardiovascular: irregularly irregular rhythm, no m/r/g, no lower extremity edema Pulmonary/Chest: normal work of breathing on room air, lungs clear to auscultation bilaterally Abdominal: soft, non-tender, non-distended Neurological: alert & oriented to self only, follows commands  Pertinent Labs: CBC Latest Ref Rng & Units 09/13/2020 09/12/2020 09/12/2020  WBC 4.0 - 10.5 K/uL 10.9(H) - 11.5(H)  Hemoglobin 12.0 - 15.0 g/dL 11.4(L) 10.6(L) 7.6(L)  Hematocrit 36.0 - 46.0 % 35.1(L) 31.4(L) 27.0(L)  Platelets 150 - 400 K/uL 282 - 408(H)    CMP Latest Ref Rng & Units 09/13/2020 09/12/2020  Glucose 70 - 99 mg/dL 79 09/14/2020)  BUN 8 - 23 mg/dL 627(O) 35(K)  Creatinine 0.44 - 1.00 mg/dL 09(F 8.18)  Sodium 2.99(B - 145 mmol/L 139 139  Potassium 3.5 - 5.1 mmol/L 4.7 4.5  Chloride 98 - 111 mmol/L 108 105  CO2 22 - 32 mmol/L 22 23  Calcium 8.9 - 10.3 mg/dL 9.6 716  Total Protein 6.5 - 8.1 g/dL - 5.9(L)  Total Bilirubin 0.3 - 1.2 mg/dL - 0.7  Alkaline Phos 38 - 126 U/L - 133(H)  AST 15 - 41 U/L - 25  ALT 0 - 44 U/L - 20     Assessment/Plan:   Active Problems:   GI  bleeding   Patient Summary: Maureen Schaefer is a 86 year old woman with history of severe dementia, CAD, HTN, HLD, TIA, hypothyroidism, mitral valve prolapse, sleep apnea, polyneuropathy, PVCs who presents to the [facility] ED from her LTAC memory unit with nausea, vomiting, and malaise found to have anemia concerning for GI bleed and new-onset atrial fibrillation.  This is hospital day 1.   Symptomatic normocytic anemia  Melena, FOBT+ Suspected UGI bleed Post-transfusion H&H 11.4 from 7.6 on admission. Presentation concerning for upper GI bleed. Stable on PPI. Per GI, options forward include EGD to rule out high risk pathology vs empiric treatment with PPI with close monitoring. GI had discussion with daughter regarding the risks and benefits of EGD, and the daughter would like to think about it. EGD is not emergent given patient's stability and no active bleeding. - GI following, appreciate recs - AM CBC - pantoprazole 40 mg IV q12h for GI prophylaxis - possible EGD in the next few days pending GI's ongoing discussions with family  New-onset atrial fibrillation with RVR Patient noted to be in Afib with RVR on admission. Rate-controlled on diltiazem gtt. No documented history. TSH wnl.  - diltiazem gtt - trend troponins as above - TSH  - cardiac telemetry  Sternal and LUE pain History of CAD Per GI, patient reportedly had sternal and LUE pain yesterday. Troponins mildly elevated, likely demand in setting of RVR  vs anemia.  - Continue to monitor  Hypothyroidism TSH on admission wnl. - continue home levothyroxine 50 mg daily  HTN HLD - continue home metoprolol 25 mg daily - continue home rosuvastatin 5 mg MWF  Severe dementia Continue home medications as below. - donepezil 10 mg nightly - aripiprazole 2 mg daily - delirium precautions - escitalopram 10 mg daily - mirtazapine 7.5 mg nightly - delirium precautions  Goals of care Family states they are aware patient is  nearing the end of her life. Has had significant physical and mental deterioration over the last year.   Diet: Full liquid, pending SLP and nutrition recs VTE: SCDs Code: DNR PT/OT recs: Pending   Please contact the on call pager after 5 pm and on weekends at [phone].  Alphonzo Severance, MD PGY-1 Internal Medicine Teaching Service Pager: [phone] 09/13/2020

## 2020-09-14 ENCOUNTER — Other Ambulatory Visit: Payer: Self-pay

## 2020-09-14 DIAGNOSIS — K922 Gastrointestinal hemorrhage, unspecified: Secondary | ICD-10-CM

## 2020-09-14 DIAGNOSIS — E440 Moderate protein-calorie malnutrition: Secondary | ICD-10-CM | POA: Insufficient documentation

## 2020-09-14 LAB — CBC WITH DIFFERENTIAL/PLATELET
Abs Immature Granulocytes: 0.06 10*3/uL (ref 0.00–0.07)
Basophils Absolute: 0 10*3/uL (ref 0.0–0.1)
Basophils Relative: 0 %
Eosinophils Absolute: 0.2 10*3/uL (ref 0.0–0.5)
Eosinophils Relative: 2 %
HCT: 32.6 % — ABNORMAL LOW (ref 36.0–46.0)
Hemoglobin: 10.4 g/dL — ABNORMAL LOW (ref 12.0–15.0)
Immature Granulocytes: 1 %
Lymphocytes Relative: 18 %
Lymphs Abs: 1.9 10*3/uL (ref 0.7–4.0)
MCH: 26.3 pg (ref 26.0–34.0)
MCHC: 31.9 g/dL (ref 30.0–36.0)
MCV: 82.5 fL (ref 80.0–100.0)
Monocytes Absolute: 1.2 10*3/uL — ABNORMAL HIGH (ref 0.1–1.0)
Monocytes Relative: 12 %
Neutro Abs: 7 10*3/uL (ref 1.7–7.7)
Neutrophils Relative %: 67 %
Platelets: 269 10*3/uL (ref 150–400)
RBC: 3.95 MIL/uL (ref 3.87–5.11)
RDW: 16.3 % — ABNORMAL HIGH (ref 11.5–15.5)
WBC: 10.4 10*3/uL (ref 4.0–10.5)
nRBC: 0 % (ref 0.0–0.2)

## 2020-09-14 LAB — URINE CULTURE: Culture: >=100000 — AB

## 2020-09-14 MED ORDER — DILTIAZEM HCL 60 MG PO TABS
30.0000 mg | ORAL_TABLET | Freq: Four times a day (QID) | ORAL | Status: AC
  Administered 2020-09-14 – 2020-09-16 (×3): 30 mg via ORAL
  Filled 2020-09-14 (×6): qty 1

## 2020-09-14 MED ORDER — DILTIAZEM HCL-DEXTROSE 125-5 MG/125ML-% IV SOLN (PREMIX)
5.0000 mg/h | INTRAVENOUS | Status: AC
  Filled 2020-09-14: qty 125

## 2020-09-14 NOTE — Progress Notes (Signed)
      Progress Note   Subjective  No changes overnight. Nursing reports no blood per rectum, no bowel movements at all. Remains on diltiazem drip. Called daughter Nedra Hai, no answer, I left a message.   Objective   Vital signs in last 24 hours: Temp:  [97.8 F (36.6 C)-98.9 F (37.2 C)] 98 F (36.7 C) (12/25 1122) Pulse Rate:  [86-106] 106 (12/25 1122) Resp:  [16-23] 20 (12/25 1122) BP: (111-128)/(50-60) 111/50 (12/25 1122) SpO2:  [93 %-96 %] 96 % (12/25 1122) Weight:  [62.6 kg] 62.6 kg (12/25 0508) Last BM Date:  (pt unable to verbalize) General:    white female in NAD Abdomen:  Soft, nontender and nondistended.  Psych:  Cooperative. Normal mood and affect.  Intake/Output from previous day: 12/24 0701 - 12/25 0700 In: 38.6 [I.V.:38.6] Out: -  Intake/Output this shift: No intake/output data recorded.  Lab Results: Recent Labs    09/12/20 0903 09/12/20 2311 09/13/20 0316 09/14/20 0155  WBC 11.5*  --  10.9* 10.4  HGB 7.6* 10.6* 11.4* 10.4*  HCT 27.0* 31.4* 35.1* 32.6*  PLT 408*  --  282 269   BMET Recent Labs    09/12/20 0903 09/13/20 0316  NA 139 139  K 4.5 4.7  CL 105 108  CO2 23 22  GLUCOSE 127* 79  BUN 42* 35*  CREATININE 1.01* 0.71  CALCIUM 10.0 9.6   LFT Recent Labs    09/12/20 0903  PROT 5.9*  ALBUMIN 2.6*  AST 25  ALT 20  ALKPHOS 133*  BILITOT 0.7   PT/INR Recent Labs    09/12/20 1243  LABPROT 13.9  INR 1.1    Studies/Results: No results found.     Assessment / Plan:    86 y/o female admitted with melena, drop in Hgb to 7s, heme positive stool. Also with history of weight loss over the past several months, mostly on liquid diet as outlined. She responded appropriately to RBC transfusion. On PPI without any recurrence of bleeding. BUN downtrending, Hgb relatively stable. Troponin mildly elevated and now in AF with RVR, on diltiazem drip which has helped to control HR.   I have discussed the situation the patient's daughter Nedra Hai  yesterday. I called again today but no answer and left a message. She has likely had an upper GI bleed which has stabilized on PPI. In light of her poor PO take and weight loss, and bleeding, EGD may be reasonable but want to address again with the family how aggressive they want to be with this - they wanted to think about it and did not give a definitive answer yesterday. Regardless, she remains on diltiazem drip and that will need to be off prior to proceeding with endoscopic evaluation. We will follow and reassess her tomorrow. If they do wish to proceed with endoscopy it would be possibly tomorrow vs. more likely Monday, pending when she can come of diltiazem drip. Will follow and reassess tomorrow, call with questions in the interim.  Ileene Patrick, MD [facility] Gastroenterology

## 2020-09-14 NOTE — Progress Notes (Signed)
HD#2 Subjective:   No acute events overnight. Hemoglobin stable at 10.4 this morning.  During evaluation at bedside this morning, the patient is sleeping initially however easily arousable. She states that she is hungry multiple times during the interview. Oriented only to self. She denies pain or discomfort. All questions and concerns addressed at bedside.   Objective:   Vital signs in last 24 hours: Vitals:   09/13/20 0011 09/13/20 0424 09/13/20 2007 09/14/20 0508  BP: 126/62 (!) 141/81 128/60 (!) 126/58  Pulse: 68 63 94 86  Resp: 14 19 16  (!) 23  Temp: 98.8 F (37.1 C) 98 F (36.7 C) 98.9 F (37.2 C) 98.6 F (37 C)  TempSrc: Axillary Axillary Axillary Axillary  SpO2: 93% 96% 95% 95%  Weight:    62.6 kg   Physical Exam Constitutional: Frail woman resting comfortably in bed, in no acute distress, very hard of hearing Cardiovascular: irregularly irregular rhythm with normal rate, no m/r/g, no lower extremity edema Pulmonary/Chest: normal work of breathing on room air, lungs clear to auscultation bilaterally Abdominal: soft, non-tender, non-distended Neurological: alert & oriented to self only  Pertinent Labs: CBC Latest Ref Rng & Units 09/14/2020 09/13/2020 09/12/2020  WBC 4.0 - 10.5 K/uL 10.4 10.9(H) -  Hemoglobin 12.0 - 15.0 g/dL 10.4(L) 11.4(L) 10.6(L)  Hematocrit 36.0 - 46.0 % 32.6(L) 35.1(L) 31.4(L)  Platelets 150 - 400 K/uL 269 282 -    CMP Latest Ref Rng & Units 09/13/2020 09/12/2020  Glucose 70 - 99 mg/dL 79 09/14/2020)  BUN 8 - 23 mg/dL 768(T) 15(B)  Creatinine 0.44 - 1.00 mg/dL 26(O 0.35)  Sodium 5.97(C - 145 mmol/L 139 139  Potassium 3.5 - 5.1 mmol/L 4.7 4.5  Chloride 98 - 111 mmol/L 108 105  CO2 22 - 32 mmol/L 22 23  Calcium 8.9 - 10.3 mg/dL 9.6 163  Total Protein 6.5 - 8.1 g/dL - 5.9(L)  Total Bilirubin 0.3 - 1.2 mg/dL - 0.7  Alkaline Phos 38 - 126 U/L - 133(H)  AST 15 - 41 U/L - 25  ALT 0 - 44 U/L - 20     Assessment/Plan:   Active Problems:    GI bleeding   Pressure injury of skin   Atrial fibrillation with RVR [facility])   Patient Summary: Maureen Schaefer is a 86 year old woman with history of severe dementia, CAD, HTN, HLD, TIA, hypothyroidism, mitral valve prolapse, sleep apnea, polyneuropathy, PVCs who presents to the [facility] ED from her LTAC memory unit with nausea, vomiting, and malaise found to have anemia concerning for GI bleed and new-onset atrial fibrillation.  This is hospital day 2.   Symptomatic normocytic anemia  Melena, FOBT+ Suspected UGI bleed Hemoglobin this morning 10.4, stable on PPI. Per GI, options forward include EGD to rule out high risk pathology vs empiric treatment with PPI with close monitoring. GI in ongoing discussion with patient's family regarding next steps. EGD is not emergent given patient's stability and no active bleeding.  - GI following, appreciate recs - AM CBC - pantoprazole 40 mg IV q12h for GI prophylaxis - possible EGD in the next few days pending GI's ongoing discussions with family  New-onset atrial fibrillation with RVR Patient noted to be in Afib with RVR on admission. Rate-controlled on diltiazem gtt. No documented history. TSH wnl. Will transition to PO diltiazem. - Wean diltiazem gtt  - diltiazem 30 mg q6h PO - Given patient's bleeding risk, she is not a candidate for long-term anticoagulation - cardiac telemetry  Sternal and  LUE pain History of CAD Troponins mildly elevated but flat, likely demand in setting of RVR vs anemia.  - Continue to monitor  Hypothyroidism TSH on admission wnl. - continue home levothyroxine 50 mg daily  HTN HLD - continue home metoprolol 25 mg daily - continue home rosuvastatin 5 mg MWF  Severe dementia Continue home medications as below. - donepezil 10 mg nightly - aripiprazole 2 mg daily - delirium precautions - escitalopram 10 mg daily - mirtazapine 7.5 mg nightly - delirium precautions   Diet: Full liquid per SLP evaluation  yesterday (12/24) VTE: SCDs due to suspected GI bleed Code: DNR   Please contact the on call pager after 5 pm and on weekends at [phone].  Alphonzo Severance, MD PGY-1 Internal Medicine Teaching Service Pager: [phone] 09/14/2020

## 2020-09-15 DIAGNOSIS — M25512 Pain in left shoulder: Secondary | ICD-10-CM | POA: Diagnosis not present

## 2020-09-15 DIAGNOSIS — K921 Melena: Secondary | ICD-10-CM | POA: Diagnosis not present

## 2020-09-15 LAB — CBC WITH DIFFERENTIAL/PLATELET
Abs Immature Granulocytes: 0.07 10*3/uL (ref 0.00–0.07)
Basophils Absolute: 0 10*3/uL (ref 0.0–0.1)
Basophils Relative: 0 %
Eosinophils Absolute: 0.3 10*3/uL (ref 0.0–0.5)
Eosinophils Relative: 3 %
HCT: 35.8 % — ABNORMAL LOW (ref 36.0–46.0)
Hemoglobin: 11.5 g/dL — ABNORMAL LOW (ref 12.0–15.0)
Immature Granulocytes: 1 %
Lymphocytes Relative: 22 %
Lymphs Abs: 2.1 10*3/uL (ref 0.7–4.0)
MCH: 27.1 pg (ref 26.0–34.0)
MCHC: 32.1 g/dL (ref 30.0–36.0)
MCV: 84.4 fL (ref 80.0–100.0)
Monocytes Absolute: 1.2 10*3/uL — ABNORMAL HIGH (ref 0.1–1.0)
Monocytes Relative: 12 %
Neutro Abs: 6.2 10*3/uL (ref 1.7–7.7)
Neutrophils Relative %: 62 %
Platelets: 273 10*3/uL (ref 150–400)
RBC: 4.24 MIL/uL (ref 3.87–5.11)
RDW: 17.2 % — ABNORMAL HIGH (ref 11.5–15.5)
WBC: 9.8 10*3/uL (ref 4.0–10.5)
nRBC: 0 % (ref 0.0–0.2)

## 2020-09-15 MED ORDER — SODIUM CHLORIDE 0.9 % IV SOLN
1.0000 g | Freq: Once | INTRAVENOUS | Status: AC
  Administered 2020-09-15: 16:00:00 1 g via INTRAVENOUS
  Filled 2020-09-15: qty 10

## 2020-09-15 NOTE — Progress Notes (Signed)
HD#3 Subjective:   No acute events overnight. Patient reportedly refused medications yesterday afternoon, spitting up crushed medications. As of this morning, patient took most of her medications.  Hemoglobin 11.5 this morning.   Ms. Bridgers was seen at bedside this morning. She was sleeping at bedside, and was more difficult to arouse than she has been. Still oriented to self only. She states she is sleepy. Per RN, patient reportedly was up much of the night.  Objective:   Vital signs in last 24 hours: Vitals:   09/14/20 0508 09/14/20 0837 09/14/20 1122 09/15/20 0523  BP: (!) 126/58 (!) 123/57 (!) 111/50 (!) 153/72  Pulse: 86 98 (!) 106 96  Resp: (!) 23 16 20 20   Temp: 98.6 F (37 C) 97.8 F (36.6 C) 98 F (36.7 C) 98.2 F (36.8 C)  TempSrc: Axillary Oral Axillary Oral  SpO2: 95% 93% 96% 95%  Weight: 62.6 kg      Physical Exam Constitutional: Frail woman sleeping in bed, more difficult to arouse this morning, in no acute distress, very hard of hearing Cardiovascular: irregularly irregular rhythm with normal rate, no m/r/g, no lower extremity edema Pulmonary/Chest: normal work of breathing on room air, lungs clear to auscultation bilaterally Abdominal: soft, non-tender, non-distended Neurological: more difficult to arouse this morning, sleepy, alert & oriented to self only  Pertinent Labs: CBC Latest Ref Rng & Units 09/15/2020 09/14/2020 09/13/2020  WBC 4.0 - 10.5 K/uL 9.8 10.4 10.9(H)  Hemoglobin 12.0 - 15.0 g/dL 11.5(L) 10.4(L) 11.4(L)  Hematocrit 36.0 - 46.0 % 35.8(L) 32.6(L) 35.1(L)  Platelets 150 - 400 K/uL 273 269 282    CMP Latest Ref Rng & Units 09/13/2020 09/12/2020  Glucose 70 - 99 mg/dL 79 09/14/2020)  BUN 8 - 23 mg/dL 629(U) 76(L)  Creatinine 0.44 - 1.00 mg/dL 46(T 0.35)  Sodium 4.65(K - 145 mmol/L 139 139  Potassium 3.5 - 5.1 mmol/L 4.7 4.5  Chloride 98 - 111 mmol/L 108 105  CO2 22 - 32 mmol/L 22 23  Calcium 8.9 - 10.3 mg/dL 9.6 812  Total Protein 6.5 -  8.1 g/dL - 5.9(L)  Total Bilirubin 0.3 - 1.2 mg/dL - 0.7  Alkaline Phos 38 - 126 U/L - 133(H)  AST 15 - 41 U/L - 25  ALT 0 - 44 U/L - 20     Assessment/Plan:   Active Problems:   Acute GI bleeding   Pressure injury of skin   Atrial fibrillation with RVR (HCC)   Malnutrition of moderate degree   Patient Summary: Ms. Elbe is a 86 year old woman with history of severe dementia, CAD, HTN, HLD, TIA, hypothyroidism, mitral valve prolapse, sleep apnea, polyneuropathy, PVCs who presents to the [facility] ED from her LTAC memory unit with nausea, vomiting, and malaise found to have anemia concerning for GI bleed and new-onset atrial fibrillation.  This is hospital day 3.   Symptomatic normocytic anemia  Melena, FOBT+ Suspected UGI bleed Goals of care Hemoglobin this morning 11.5, stable on PPI. Patient more difficult to arouse this morning, reportedly up much of the night. Per GI, options forward include EGD to rule out high risk pathology vs empiric treatment with PPI with close monitoring. GI in ongoing discussion with patient's family regarding next steps. EGD is not emergent given patient's stability and no active bleeding. Depending on timing of possible EGD, will obtain palliative care consult inpatient vs recommend outpatient palliative consult for goals of care conversation. - GI following, awaiting family's decision regarding aggressive vs conservative management  of patient's suspected bleed - possible EGD in the next few days pending GI's ongoing discussions with family - AM CBC - pantoprazole 40 mg IV q12h for GI prophylaxis  New-onset atrial fibrillation with RVR Patient noted to be in Afib with RVR on admission. Initially rate-controlled on diltiazem gtt. Transitioned to PO diltiazem yesterday with good effect. - diltiazem 30 mg q6h PO - consider transition to long-acting diltiazem tomorrow - Given patient's bleeding risk, she is not a candidate for long-term  anticoagulation - cardiac telemetry  Hypothyroidism TSH on admission wnl. - continue home levothyroxine 50 mg daily  HTN HLD - continue home metoprolol 25 mg daily - continue home rosuvastatin 5 mg MWF  Severe dementia Continue home medications as below. - donepezil 10 mg nightly - aripiprazole 2 mg daily - delirium precautions - escitalopram 10 mg daily - mirtazapine 7.5 mg nightly - delirium precautions   Diet: Full liquid per SLP evaluation (12/24) VTE: SCDs due to suspected GI bleed Code: DNR   Please contact the on call pager after 5 pm and on weekends at [phone].  Alphonzo Severance, MD PGY-1 Internal Medicine Teaching Service Pager: [phone] 09/15/2020

## 2020-09-15 NOTE — Progress Notes (Signed)
Told by day shift patient is refusing to eat or take medications this afternoon. Patient is spitting up crushed medications. Attempted to give po meds in apple sauce, patient was able to finish more than half of it but spit some up. Notified Internal medicine.    0600 update: pt was able to take medications by mouth but did spit some of it out. Will continue to monitor patient.

## 2020-09-15 NOTE — Progress Notes (Signed)
      Progress Note   Subjective  Passed a brown stool this AM per nursing staff, no blood. Hgb stable.    Objective   Vital signs in last 24 hours: Temp:  [98.2 F (36.8 C)] 98.2 F (36.8 C) (12/26 0523) Pulse Rate:  [79-96] 79 (12/26 0939) Resp:  [20] 20 (12/26 0523) BP: (141-153)/(72-91) 141/91 (12/26 0939) SpO2:  [95 %] 95 % (12/26 0523) Last BM Date:  (pt unable to verbalize) General:    white female in NAD, sleeping Abdomen:  Soft, nontender and nondistended.   Intake/Output from previous day: 12/25 0701 - 12/26 0700 In: 100 [P.O.:100] Out: 200 [Urine:200] Intake/Output this shift: No intake/output data recorded.  Lab Results: Recent Labs    09/13/20 0316 09/14/20 0155 09/15/20 0451  WBC 10.9* 10.4 9.8  HGB 11.4* 10.4* 11.5*  HCT 35.1* 32.6* 35.8*  PLT 282 269 273   BMET Recent Labs    09/13/20 0316  NA 139  K 4.7  CL 108  CO2 22  GLUCOSE 79  BUN 35*  CREATININE 0.71  CALCIUM 9.6   LFT No results for input(s): PROT, ALBUMIN, AST, ALT, ALKPHOS, BILITOT, BILIDIR, IBILI in the last 72 hours. PT/INR No results for input(s): LABPROT, INR in the last 72 hours.  Studies/Results: No results found.     Assessment / Plan:   85 y/o female admittedwith melena, drop in Hgbto 7s, heme positive stool. Also with history of weight loss over the past several months, mostly on liquid diet as outlined. Course complicated by AF with RVR, led to diltiazem drip. She has been on PPI empirically. Her BUN downtrended, Hgb stable, passed a brown stool this AM. Now off diltiazem drip and HR controlled with oral meds.   I had conversation with the patient's daughter, Nedra Hai, today. We discussed options - EGD to further evaluate her symptoms, or holding off on EGD and continue empiric PPI since she is doing better. We discussed risks / benefits of each of these options. Given she is doing better with no further bleeding, Nedra Hai favored holding off on EGD now which I think is  quite reasonable given her age and comorbidities. I would transition protonix to 40mg  BID for 1 month, avoid all NSAIDs, and observe for recurrence of symptoms or anemia. I would continue protonix 40mg  once daily there after.   We will sign off for now, please all if rebleeding or if family wishes to pursue further evaluation.   , MD [facility]

## 2020-09-15 NOTE — Progress Notes (Signed)
Patient unable to complete H&P at this time due to confusion. Will pass on to day shift.

## 2020-09-15 NOTE — Discharge Summary (Addendum)
Name: Maureen Schaefer MRN: [id number] DOB: [birth year] 86 y.o. PCP: Pcp, No  Date of Admission: 09/12/2020  8:48 AM Date of Discharge: 09/17/2020 Attending Physician: Anne Shutter, MD  Discharge Diagnosis: 1. Acute GI bleeding 2. Pressure injury of skin 3. Atrial fibrillation with RVR 4. Malnutrition of moderate degree 5. Urinary tract infection  Discharge Medications: Allergies as of 09/17/2020      Reactions   Coconut Flavor       Medication List    STOP taking these medications   nitrofurantoin 100 MG capsule Commonly known as: MACRODANTIN     TAKE these medications   acetaminophen 500 MG tablet Commonly known as: TYLENOL Take 2 tablets (1,000 mg total) by mouth every 6 (six) hours as needed for moderate pain. What changed: when to take this   alum & mag hydroxide-simeth 200-200-20 MG/5ML suspension Commonly known as: MAALOX/MYLANTA Take 30 mLs by mouth every 8 (eight) hours as needed for indigestion or heartburn.   ARIPiprazole 2 MG tablet Commonly known as: ABILIFY Take 1 tablet (2 mg total) by mouth daily.   cefdinir 300 MG capsule Commonly known as: OMNICEF Take 1 capsule (300 mg total) by mouth every 12 (twelve) hours.   celecoxib 100 MG capsule Commonly known as: CELEBREX Take 1 capsule (100 mg total) by mouth daily.   cholecalciferol 25 MCG (1000 UNIT) tablet Commonly known as: VITAMIN D3 Take 1 tablet (1,000 Units total) by mouth daily.   diltiazem 120 MG 24 hr capsule Commonly known as: CARDIZEM CD Take 1 capsule (120 mg total) by mouth daily. Start taking on: September 18, 2020   donepezil 10 MG tablet Commonly known as: ARICEPT Take 1 tablet (10 mg total) by mouth at bedtime.   escitalopram 10 MG tablet Commonly known as: LEXAPRO Take 1 tablet (10 mg total) by mouth daily.   estradiol 0.1 MG/GM vaginal cream Commonly known as: ESTRACE Place 1 Applicatorful vaginally 3 (three) times a week. Start taking on: September 18, 2020    feeding supplement Liqd Take 237 mLs by mouth 3 (three) times daily between meals. chocolate   levothyroxine 50 MCG tablet Commonly known as: SYNTHROID Take 1 tablet (50 mcg total) by mouth daily before breakfast.   loperamide 2 MG tablet Commonly known as: IMODIUM A-D Take 1 tablet (2 mg total) by mouth daily as needed for diarrhea or loose stools.   magnesium hydroxide 400 MG/5ML suspension Commonly known as: MILK OF MAGNESIA Take 30 mLs by mouth daily as needed for mild constipation.   metoprolol tartrate 25 MG tablet Commonly known as: LOPRESSOR Take 1 tablet (25 mg total) by mouth daily.   mirtazapine 7.5 MG tablet Commonly known as: REMERON Take 1 tablet (7.5 mg total) by mouth at bedtime.   ondansetron 4 MG disintegrating tablet Commonly known as: ZOFRAN-ODT Take 1 tablet (4 mg total) by mouth every 6 (six) hours as needed for nausea or vomiting.   pantoprazole 40 MG tablet Commonly known as: Protonix Take 1 tablet (40 mg total) by mouth 2 (two) times daily for 30 days, THEN 1 tablet (40 mg total) daily. Start taking on: September 17, 2020   polyethylene glycol 17 g packet Commonly known as: MIRALAX / GLYCOLAX Take 17 g by mouth daily.   polyvinyl alcohol 1.4 % ophthalmic solution Commonly known as: LIQUIFILM TEARS Place 2 drops into both eyes in the morning, at noon, and at bedtime.   rosuvastatin 5 MG tablet Commonly known as: CRESTOR Take 1 tablet (5 mg  total) by mouth as directed. Take 1 tablet by mouth on Monday, Wednesday and friday   triamcinolone 55 MCG/ACT Aero nasal inhaler Commonly known as: NASACORT Place 2 sprays into the nose daily.            Discharge Care Instructions  (From admission, onward)         Start     Ordered   09/17/20 0000  Change dressing (specify)       Comments: Dressing change: Daily.   09/17/20 1318          Disposition and follow-up:   Maureen Schaefer was discharged from [facility] in  Stable condition.  At the hospital follow up visit please address:  1.    Symptomatic normocytic anemia Suspected upper GI bleed - Hemoglobin on discharge 10.7 (09/16/20) - GI consulted during this admission and with shared decision making with the patient's daughter, Nedra Hai, held off on EGD to investigate for likely upper GI bleed in favor of continuing empiric PPI given that patient had no further black stools and stable hemoglobin - Patient does not have GI follow-up. Per GI (Dr. Adela Lank), patient does not likely need follow-up if she is otherwise stable and family would not want to pursue endoscopy. If she has persistent symptoms or doesn't do well, etc, GI can see her back  Atrial fibrillation - Patient with no known history of atrial fibrillation - Rate controlled during admission first on diltiazem drip then subsequently transitioned to oral diltiazem 24 hr capsule 120 mg daily - She is not a candidate for long-term anticoagulation due to her GI bleed and comorbidities - Discuss further with patient and family  Goals of care - consider outpatient palliative consult for more in-depth goals of care conversation with family  UTI - Patient treated for UTI after noted to have increased sleepiness on 09/15/20 in setting of admission urinalysis positive for nitrites and grew >100k CFUs, suprapubic tenderness - Received ceftriaxone 1 mg IV x 2 - transitioned to 3-day course of PO cefdinir 300 mg twice daily to complete total 5-day course  2.  Labs / imaging needed at time of follow-up: CBC  3.  Pending labs/ test needing follow-up: None  Follow-up Appointments: None  Hospital Course by problem list:  Maureen Schaefer is a 86 year old woman with history of severe dementia, CAD, HTN, HLD, TIA, hypothyroidism, mitral valve prolapse, sleep apnea, polyneuropathy, PVCs who presents to the [facility] ED from her LTAC memory unit with nausea, vomiting, and malaise found to have anemia concerning  for GI bleed and new-onset atrial fibrillation.  Symptomaticnormocyticanemia  Melena, FOBT+ Suspected UGI bleed Goals of care Hemoglobin 7.6 on admission with unknown baseline, HDS, no prior labs for comparison. FOBT positive and with black stool noted in diaper by EDP. Celebrex on patient's medication list but no aspirin or blood thinners. Per the family, unsure if/when patient had colonoscopy, no history of cancer. High suspicion for GI bleed. Differential includes ulcer, gastritis, neoplasm. GI consulted by EDP who evaluated patient and recommend RBC transfusion, IV PPI, NPO after midnight for possible upper endoscopy this admission pending family's wishes. Hemoglobin subsequently improved and stabilized without need for further transfusion. GI was in ongoing discussion with patient's family regarding next steps since EGD is not emergent given patient's stability and no active bleeding. Ultimately the decision was made to not pursue EGD given patient's stabilization, apparent lack of ongoing bleeding, and comorbidities. She was discharged home on PPI therapy. GI follow-up as needed.  New-onset atrial fibrillation with RVR Patient noted to be in Afib with RVR on admission. Initially rate-controlled on diltiazem gtt. Transitioned to PO diltiazem with good effect. She was not anticoagulated in setting of suspected GI bleed and given her ultimately not being a candidate for long-term anticoagulation. Discharged home on PO diltiazem.   UTI Patient treated for UTI after noted to have increased sleepiness on 09/15/20 in setting of admission urinalysis positive for nitrites and grew >100k CFUs, suprapubic tenderness - Received ceftriaxone 1 mg IV x 2 doses - transitioned to PO cefdinir 300 mg twice daily to complete total 5-day course  Discharge Vitals:   BP (!) 141/91   Pulse 79   Temp 98.2 F (36.8 C) (Oral)   Resp 20   Wt 62.6 kg   SpO2 95%   BMI 22.27 kg/m   Pertinent Labs, Studies, and  Procedures:   CBC Latest Ref Rng & Units 09/15/2020 09/14/2020 09/13/2020  WBC 4.0 - 10.5 K/uL 9.8 10.4 10.9(H)  Hemoglobin 12.0 - 15.0 g/dL 11.5(L) 10.4(L) 11.4(L)  Hematocrit 36.0 - 46.0 % 35.8(L) 32.6(L) 35.1(L)  Platelets 150 - 400 K/uL 273 269 282     Discharge Instructions:  It was a pleasure taking care of Ms. Forsyth. She was admitted for symptomatic anemia. It is likely that she had an upper GI bleed. She received a unit of blood with good effect. GI doctors were consulted. She was treated with strong antacid medication.   She will continue on Protonix 40 mg twice daily for 1 month. It will be important for her to avoid all NSAID medications (e.g., ibuprofen) and observe for recurrence of symptoms or anemia. After the 1 month of therapy, continue the Protonix 40 mg once daily thereafter. Follow-up with GI on an as-needed basis.  She was discharged to finished 3 more days of antibiotics with Cefdinir 300 mg twice daily. Monitor for any urinary symptoms.   Take care!  Signed: Steffanie Rainwater, MD 09/17/2020, 1:23 PM   Pager: [phone]   Internal Medicine Attending Note:  I saw and examined the patient on the day of discharge. I reviewed and agree with the discharge summary written by the house staff.  Jessy Oto, M.D., Ph.D.

## 2020-09-15 NOTE — Social Work (Signed)
CSW was messaged by MD Claudette Laws in regards to when patient could return to [facility]. CSW called [facility] and was transferred twice and then it went to VM. CSW has spoken to CSW that will be covering that floor tomorrowr therefore they will follow with facility.  CSW followed up with MD Claudette Laws.  TOC team will continue to assist with discharge planning needs.

## 2020-09-16 LAB — CBC WITH DIFFERENTIAL/PLATELET
Abs Immature Granulocytes: 0.06 10*3/uL (ref 0.00–0.07)
Basophils Absolute: 0 10*3/uL (ref 0.0–0.1)
Basophils Relative: 0 %
Eosinophils Absolute: 0.1 10*3/uL (ref 0.0–0.5)
Eosinophils Relative: 1 %
HCT: 35 % — ABNORMAL LOW (ref 36.0–46.0)
Hemoglobin: 10.7 g/dL — ABNORMAL LOW (ref 12.0–15.0)
Immature Granulocytes: 1 %
Lymphocytes Relative: 18 %
Lymphs Abs: 1.9 10*3/uL (ref 0.7–4.0)
MCH: 26.4 pg (ref 26.0–34.0)
MCHC: 30.6 g/dL (ref 30.0–36.0)
MCV: 86.4 fL (ref 80.0–100.0)
Monocytes Absolute: 1.2 10*3/uL — ABNORMAL HIGH (ref 0.1–1.0)
Monocytes Relative: 12 %
Neutro Abs: 7.2 10*3/uL (ref 1.7–7.7)
Neutrophils Relative %: 68 %
Platelets: 252 10*3/uL (ref 150–400)
RBC: 4.05 MIL/uL (ref 3.87–5.11)
RDW: 17.2 % — ABNORMAL HIGH (ref 11.5–15.5)
WBC: 10.6 10*3/uL — ABNORMAL HIGH (ref 4.0–10.5)
nRBC: 0 % (ref 0.0–0.2)

## 2020-09-16 LAB — SARS CORONAVIRUS 2 (TAT 6-24 HRS): SARS Coronavirus 2: NEGATIVE

## 2020-09-16 MED ORDER — SODIUM CHLORIDE 0.9 % IV SOLN
1.0000 g | Freq: Once | INTRAVENOUS | Status: AC
  Administered 2020-09-16: 16:00:00 1 g via INTRAVENOUS
  Filled 2020-09-16: qty 10

## 2020-09-16 MED ORDER — CEFDINIR 300 MG PO CAPS
300.0000 mg | ORAL_CAPSULE | Freq: Two times a day (BID) | ORAL | Status: DC
  Administered 2020-09-17: 15:00:00 300 mg via ORAL
  Filled 2020-09-16 (×2): qty 1

## 2020-09-16 MED ORDER — DILTIAZEM HCL ER COATED BEADS 120 MG PO CP24
120.0000 mg | ORAL_CAPSULE | Freq: Every day | ORAL | Status: DC
  Administered 2020-09-17: 11:00:00 120 mg via ORAL
  Filled 2020-09-16: qty 1

## 2020-09-16 NOTE — Care Management Important Message (Signed)
Important Message  Patient Details  Name: Maureen Schaefer MRN: [id number] Date of Birth: 1927-07-12   Medicare Important Message Given:  Yes     Renie Ora 09/16/2020, 10:47 AM

## 2020-09-16 NOTE — Evaluation (Signed)
Occupational Therapy Evaluation Patient Details Name: Maureen Schaefer MRN: [id number] DOB: Mar 31, 1927 Today's Date: 09/16/2020    History of Present Illness 86yo female presenting to the ED from her long term care facility with nausea/vomiting and malaise. FOBT positive. Admitted with symptomatic normocytic anemia, acute A-fib with RVR. PMH CAD, severe dementia, HTN, polyneuropathy, TIA   Clinical Impression   This 86 y/o female presents with the above. PTA pt residing in memory care unit at [facility], per chart mostly bed/wheelchair bound as of recent and suspect receiving assist for ADL tasks. Pt asleep upon arrival but does arouse and engage intermittently with therapist. Pt following simple commands minimally (approx 25%), engaging in simple grooming task (face washing) with maxA and self-feeding with totalA today. Overall she requires totalA for mobility tasks. Pt does become intermittently agitated especially with repositioning at bed level and readjusting bed itself (raising HOB). HR fluctuating up to low 100s. Suspect pt may be near/at her baseline function. Recommend continued OT services after return to SNF/memory care unit to further maximize her safety and independence with ADL/moblity tasks and to reduce caregiver burden. Will defer further OT services to next venue given pt with no acute OT needs. Acute OT to sign off, thank you for this referral.     Follow Up Recommendations  SNF (return to memory care unit)    Equipment Recommendations  Other (comment) (defer to next venue)           Precautions / Restrictions Precautions Precautions: Fall Precaution Comments: severe dementia, watch HR Restrictions Weight Bearing Restrictions: No      Mobility Bed Mobility Overal bed mobility: Needs Assistance             General bed mobility comments: totalA to reposition, pt initially agreeable to attempt sitting EOB but then becoming agitated with HOB adjustments and covers  being removed                 ADL either performed or assessed with clinical judgement   ADL Overall ADL's : Needs assistance/impaired Eating/Feeding: Total assistance Eating/Feeding Details (indicate cue type and reason): RN providing totalA for spoonful of pudding Grooming: Maximal assistance;Bed level;Wash/dry face Grooming Details (indicate cue type and reason): cues to initiate and assist for thoroughness                               General ADL Comments: pt otherwise totalA for ADL, partly due to cognitive status     Vision         Perception     Praxis      Pertinent Vitals/Pain Pain Assessment: Faces Faces Pain Scale: No hurt Pain Intervention(s): Monitored during session     Hand Dominance     Extremity/Trunk Assessment Upper Extremity Assessment Upper Extremity Assessment: Generalized weakness;Difficult to assess due to impaired cognition   Lower Extremity Assessment Lower Extremity Assessment: Defer to PT evaluation       Communication Communication Communication: Expressive difficulties (garbled speech at times)   Cognition Arousal/Alertness: Awake/alert Behavior During Therapy: Flat affect Overall Cognitive Status: History of cognitive impairments - at baseline                                 General Comments: pt with hx of dementia, following commands with both L and R hand to squeeze therapist's hands and to wipe her mouth. pt talking  to therapist but confused, asking for a "smooth cup of coffee". pt intermittently agitated especially with moving the bed/adjusting [facility]   General Comments       Exercises     Shoulder Instructions      Home Living Family/patient expects to be discharged to:: Skilled nursing facility                                 Additional Comments: patient unable to provide history- per chart, seems that she was ambulatory but in the past year has had a sharp decline and  spends most of her time in the bed or WC; pt resides at [facility] unit      Prior Functioning/Environment Level of Independence: Needs assistance  Gait / Transfers Assistance Needed: per chart pt has been bed/wheelchair bound as of recent ADL's / Homemaking Assistance Needed: suspect assist for majority of ADL            OT Problem List: Decreased range of motion;Decreased strength;Decreased activity tolerance;Impaired balance (sitting and/or standing);Decreased cognition;Decreased safety awareness;Decreased knowledge of use of DME or AE      OT Treatment/Interventions:      OT Goals(Current goals can be found in the care plan section) Acute Rehab OT Goals Patient Stated Goal: "water" OT Goal Formulation: All assessment and education complete, DC therapy  OT Frequency:     Barriers to D/C:            Co-evaluation              AM-PAC OT "6 Clicks" Daily Activity     Outcome Measure Help from another person eating meals?: Total Help from another person taking care of personal grooming?: A Lot Help from another person toileting, which includes using toliet, bedpan, or urinal?: Total Help from another person bathing (including washing, rinsing, drying)?: Total Help from another person to put on and taking off regular upper body clothing?: Total Help from another person to put on and taking off regular lower body clothing?: Total 6 Click Score: 7   End of Session Nurse Communication: Mobility status  Activity Tolerance: Patient tolerated treatment well Patient left: in bed;with call bell/phone within reach;with bed alarm set;with nursing/sitter in room  OT Visit Diagnosis: Other abnormalities of gait and mobility (R26.89);Muscle weakness (generalized) (M62.81);Other symptoms and signs involving cognitive function                Time: 9390-3009 OT Time Calculation (min): 18 min Charges:  OT General Charges $OT Visit: 1 Visit OT Evaluation $OT Eval  Moderate Complexity: 1 Mod  Marcy Siren, OT Acute Rehabilitation Services Pager [phone] Office [phone]  Orlando Penner 09/16/2020, 5:04 PM

## 2020-09-16 NOTE — Evaluation (Signed)
Physical Therapy Evaluation Patient Details Name: Maureen Schaefer MRN: [id number] DOB: Feb 05, 1927 Today's Date: 09/16/2020   History of Present Illness  86yo female presenting to the ED from her long term care facility with nausea/vomiting and malaise. FOBT positive. Admitted with symptomatic normocytic anemia, acute A-fib with RVR. PMH CAD, severe dementia, HTN, polyneuropathy, TIA  Clinical Impression   Patient received in bed, awake and alert but really oriented to herself only. Unable to follow simple commands and demonstrates bilateral ankle plantarflexion contractures with the L ankle a bit inverted as well. Total assist for rolling in bed without initiation or effort from patient. Tells me to leave her toes alone. Family not present to confirm baseline cognition but she does have a history of severe dementia. HR ranging from 90s to as much as 123 during session. Left in bed with all needs met, bed alarm active. Feel she is most likely at her baseline going by information available in chart, and has rather limited potential to participate or progress with skilled PT services given her severe dementia. Signing off, thank you for the referral!     Follow Up Recommendations SNF;Supervision/Assistance - 24 hour    Equipment Recommendations  Hospital bed;Other (comment);Wheelchair (measurements PT);Wheelchair cushion (measurements PT) (hoyer lift)    Recommendations for Other Services       Precautions / Restrictions Precautions Precautions: Fall;Other (comment) Precaution Comments: severe dementia, watch HR Restrictions Weight Bearing Restrictions: No      Mobility  Bed Mobility Overal bed mobility: Needs Assistance Bed Mobility: Rolling Rolling: Total assist         General bed mobility comments: totalA for rolling, no initiation or assistance given by patient    Transfers                 General transfer comment: deferred/patient refusing  Ambulation/Gait              General Gait Details: deferred/patient refusing  Stairs            Wheelchair Mobility    Modified Rankin (Stroke Patients Only)       Balance                                             Pertinent Vitals/Pain Pain Assessment: Faces Faces Pain Scale: No hurt Pain Intervention(s): Limited activity within patient's tolerance;Monitored during session    Home Living Family/patient expects to be discharged to:: Skilled nursing facility                 Additional Comments: patient unable to provide history- per chart, seems that she was ambulatory but in the past year has had a sharp decline and spends most of her time in the bed or WC    Prior Function Level of Independence: Needs assistance   Gait / Transfers Assistance Needed: totalA  ADL's / Homemaking Assistance Needed: totalA        Hand Dominance        Extremity/Trunk Assessment   Upper Extremity Assessment Upper Extremity Assessment: Generalized weakness;Difficult to assess due to impaired cognition    Lower Extremity Assessment Lower Extremity Assessment: Generalized weakness;Difficult to assess due to impaired cognition    Cervical / Trunk Assessment Cervical / Trunk Assessment: Kyphotic  Communication   Communication: Expressive difficulties (mumbles and hypophonic)  Cognition Arousal/Alertness: Awake/alert Behavior During Therapy: Flat affect Overall Cognitive  Status: History of cognitive impairments - at baseline                                 General Comments: severe dementia at baseline- barely oriented to herself. Family not present to compare to her cognitive baseline. Not able to follow simple, one step commands.      General Comments General comments (skin integrity, edema, etc.): unable to get to EOB today for balance assessment    Exercises     Assessment/Plan    PT Assessment All further PT needs can be met in the next venue  of care  PT Problem List Decreased strength;Decreased cognition;Decreased activity tolerance;Decreased safety awareness;Decreased balance;Decreased mobility       PT Treatment Interventions      PT Goals (Current goals can be found in the Care Plan section)  Acute Rehab PT Goals PT Goal Formulation: All assessment and education complete, DC therapy    Frequency     Barriers to discharge        Co-evaluation               AM-PAC PT "6 Clicks" Mobility  Outcome Measure Help needed turning from your back to your side while in a flat bed without using bedrails?: Total Help needed moving from lying on your back to sitting on the side of a flat bed without using bedrails?: Total Help needed moving to and from a bed to a chair (including a wheelchair)?: Total Help needed standing up from a chair using your arms (e.g., wheelchair or bedside chair)?: Total Help needed to walk in hospital room?: Total Help needed climbing 3-5 steps with a railing? : Total 6 Click Score: 6    End of Session   Activity Tolerance: Other (comment) (limited by cognition/participation- severe dementia) Patient left: in bed;with call bell/phone within reach;with bed alarm set Nurse Communication: Mobility status PT Visit Diagnosis: Muscle weakness (generalized) (M62.81);Other abnormalities of gait and mobility (R26.89)    Time: 3428-7681 PT Time Calculation (min) (ACUTE ONLY): 9 min   Charges:   PT Evaluation $PT Eval Moderate Complexity: 1 Mod          Windell Norfolk, DPT, PN1   Supplemental Physical Therapist [place]    Pager [phone] Acute Rehab Office [phone]

## 2020-09-16 NOTE — Progress Notes (Signed)
HD#4 Subjective:   No acute events overnight. Received ceftriaxone for presumed UTI.  This morning during rounds, patient evaluated at bedside. She is sleeping comfortably at first, easily arousable to voice. Oriented to self only. Less sleepy compared to yesterday. Reports feeling "alright" today. Foley catheter with red-colored urine output.   Objective:   Vital signs in last 24 hours: Vitals:   09/15/20 0523 09/15/20 0939 09/15/20 2215 09/16/20 0637  BP: (!) 153/72 (!) 141/91 136/88 (!) 147/53  Pulse: 96 79 88 (!) 114  Resp: 20   20  Temp: 98.2 F (36.8 C)  (!) 97.4 F (36.3 C) 98.7 F (37.1 C)  TempSrc: Oral  Axillary Oral  SpO2: 95%  94%   Weight:    62.5 kg   Physical Exam Constitutional: Frail woman sleeping in bed, arouses easily to voice, in no acute distress, very hard of hearing Cardiovascular: irregularly irregular rhythm with normal rate, no m/r/g, no lower extremity edema Abdominal: soft, suprapubic tenderness to palpation, non-distended Neurological: more easily arousable this morning, less sleepy, alert & oriented to self only  Pertinent Labs: CBC Latest Ref Rng & Units 09/16/2020 09/15/2020 09/14/2020  WBC 4.0 - 10.5 K/uL 10.6(H) 9.8 10.4  Hemoglobin 12.0 - 15.0 g/dL 10.7(L) 11.5(L) 10.4(L)  Hematocrit 36.0 - 46.0 % 35.0(L) 35.8(L) 32.6(L)  Platelets 150 - 400 K/uL 252 273 269    CMP Latest Ref Rng & Units 09/13/2020 09/12/2020  Glucose 70 - 99 mg/dL 79 938(H)  BUN 8 - 23 mg/dL 82(X) 93(Z)  Creatinine 0.44 - 1.00 mg/dL 1.69 6.78(L)  Sodium 381 - 145 mmol/L 139 139  Potassium 3.5 - 5.1 mmol/L 4.7 4.5  Chloride 98 - 111 mmol/L 108 105  CO2 22 - 32 mmol/L 22 23  Calcium 8.9 - 10.3 mg/dL 9.6 01.7  Total Protein 6.5 - 8.1 g/dL - 5.9(L)  Total Bilirubin 0.3 - 1.2 mg/dL - 0.7  Alkaline Phos 38 - 126 U/L - 133(H)  AST 15 - 41 U/L - 25  ALT 0 - 44 U/L - 20     Assessment/Plan:   Active Problems:   Acute GI bleeding   Pressure injury of skin    Atrial fibrillation with RVR (HCC)   Malnutrition of moderate degree   Patient Summary: Ms. Polimeni is a 86 year old woman with history of severe dementia, CAD, HTN, HLD, TIA, hypothyroidism, mitral valve prolapse, sleep apnea, polyneuropathy, PVCs who presents to the [facility] ED from her LTAC memory unit with nausea, vomiting, and malaise found to have anemia concerning for GI bleed and new-onset atrial fibrillation.  This is hospital day 4.   Symptomatic normocytic anemia  Melena, FOBT+ Suspected UGI bleed Goals of care Hemoglobin this morning 10.7, stable on PPI. Patient with improved mental status this morning after initiation of treatment for UTI yesterday. Per GI discussion with family, given the patient's stable hemoglobin with conservative treatment, will not pursue EGD and continue with PPI treatment. - GI following, appreciate recs, they are signing off - no EGD per family - transition IV Protonix to 40 mg BID for 1 month, avoid all NSAIDs, observe for recurrence of symptoms or anemia, then continue Protonix 40 mg once daily thereafter   E coli UTI Urinalysis on admission positive for nitrites with few bacteria. Culture subsequently grew >100k CFU E coli. In the setting of the patient's change in mental status tomorrow, treated with ceftriaxone 1 g yesterday. Mental status improved this morning. Red-tinged urine noted in purewick cannister this  morning.  - s/p ceftriaxone 1 g  - transition to PO cefdinir for total 7-day course  New-onset atrial fibrillation with RVR Patient noted to be in Afib with RVR on admission. Initially rate-controlled on diltiazem gtt. Transitioned to PO diltiazem with good effect. - diltiazem 30 mg q6h PO - Transition to long-acting diltiazem tomorrow - Given patient's bleeding risk, she is not a candidate for long-term anticoagulation - cardiac telemetry  Hypothyroidism TSH on admission wnl. - continue home levothyroxine 50 mg  daily  HTN HLD - continue home metoprolol 25 mg daily - continue home rosuvastatin 5 mg MWF  Severe dementia Continue home medications as below. - donepezil 10 mg nightly - aripiprazole 2 mg daily - delirium precautions - escitalopram 10 mg daily - mirtazapine 7.5 mg nightly - delirium precautions   Diet: Full liquid per SLP evaluation (12/24) VTE: SCDs due to suspected GI bleed Code: DNR   Please contact the on call pager after 5 pm and on weekends at [phone].  Alphonzo Severance, MD PGY-1 Internal Medicine Teaching Service Pager: [phone] 09/16/2020

## 2020-09-16 NOTE — Progress Notes (Signed)
Patient refusing or spitting out all meds. Internal medicine aware.

## 2020-09-16 NOTE — NC FL2 (Addendum)
MEDICAID FL2 LEVEL OF CARE SCREENING TOOL     IDENTIFICATION  Patient Name: Maureen Schaefer Birthdate: 06-24-1927 Sex: female Admission Date (Current Location): 09/12/2020  [facility] and [place] Number:  Producer, television/film/video and Address:  [facility] [place], [address] [place], [place] [address]      Provider Number: [id number]  Attending Physician Name and Address:  Anne Shutter, MD  Relative Name and Phone Number:  Beola Cord [phone]    Current Level of Care: Hospital Recommended Level of Care: Assisted Living Facility Prior Approval Number:    Date Approved/Denied:   PASRR Number:    Discharge Plan: Other (Comment) (ALF)    Current Diagnoses: Patient Active Problem List   Diagnosis Date Noted  . Malnutrition of moderate degree 09/14/2020  . Pressure injury of skin 09/13/2020  . Atrial fibrillation with RVR (HCC)   . Acute GI bleeding 09/12/2020  . Symptomatic anemia   . Melena     Orientation RESPIRATION BLADDER Height & Weight     Self  Normal External catheter Weight: 137 lb 14 oz (62.5 kg) Height:     BEHAVIORAL SYMPTOMS/MOOD NEUROLOGICAL BOWEL NUTRITION STATUS      Incontinent Diet (See DC Summary)Full Liquid Diet  AMBULATORY STATUS COMMUNICATION OF NEEDS Skin   Extensive Assist Verbally PU Stage and Appropriate Care                       Personal Care Assistance Level of Assistance  Bathing,Feeding,Dressing Bathing Assistance: Maximum assistance Feeding assistance: Limited assistance Dressing Assistance: Maximum assistance     Functional Limitations Info  Sight,Hearing,Speech Sight Info: Adequate Hearing Info: Adequate Speech Info: Adequate    SPECIAL CARE FACTORS FREQUENCY   (PI ST 1 L Hip, Sacrum)                    Contractures Contractures Info: Not present    Additional Factors Info  Code Status,Allergies Code Status Info: DNR Allergies Info: Coconut Flavor           Current  Medications (09/16/2020):  This is the current hospital active medication list Current Facility-Administered Medications  Medication Dose Route Frequency Provider Last Rate Last Admin  . (feeding supplement) PROSource Plus liquid 30 mL  30 mL Oral BID WC Earl Lagos, MD   30 mL at 09/14/20 0907  . 0.9 %  sodium chloride infusion (Manually program via Guardrails IV Fluids)   Intravenous Once Jacalyn Lefevre, MD      . ARIPiprazole (ABILIFY) tablet 2 mg  2 mg Oral Daily Dolan Amen, MD   2 mg at 09/12/20 2159  . diltiazem (CARDIZEM) tablet 30 mg  30 mg Oral Q6H Alphonzo Severance, MD   30 mg at 09/15/20 0532  . donepezil (ARICEPT) tablet 10 mg  10 mg Oral QHS Dolan Amen, MD   10 mg at 09/14/20 2124  . escitalopram (LEXAPRO) tablet 10 mg  10 mg Oral Daily Dolan Amen, MD   10 mg at 09/12/20 2158  . feeding supplement (ENSURE ENLIVE / ENSURE PLUS) liquid 237 mL  237 mL Oral BID BM Narendra, Nischal, MD   237 mL at 09/15/20 1045  . levothyroxine (SYNTHROID) tablet 50 mcg  50 mcg Oral QAC breakfast Dolan Amen, MD   50 mcg at 09/15/20 0532  . metoprolol tartrate (LOPRESSOR) tablet 25 mg  25 mg Oral Daily Dolan Amen, MD   25 mg at 09/12/20 2159  .  mirtazapine (REMERON) tablet 7.5 mg  7.5 mg Oral QHS Dolan Amen, MD   7.5 mg at 09/14/20 2127  . multivitamin with minerals tablet 1 tablet  1 tablet Oral Daily Earl Lagos, MD   1 tablet at 09/13/20 1827  . pantoprazole (PROTONIX) injection 40 mg  40 mg Intravenous Q12H Dianah Field, PA-C   40 mg at 09/15/20 2200  . rosuvastatin (CRESTOR) tablet 5 mg  5 mg Oral Q M,W,F Dolan Amen, MD         Discharge Medications:  acetaminophen 500 MG tablet Commonly known as: TYLENOL Take 2 tablets (1,000 mg total) by mouth every 6 (six) hours as needed for moderate pain. What changed: when to take this   alum & mag hydroxide-simeth 200-200-20 MG/5ML suspension Commonly known as: MAALOX/MYLANTA Take 30 mLs by mouth every 8  (eight) hours as needed for indigestion or heartburn.   ARIPiprazole 2 MG tablet Commonly known as: ABILIFY Take 1 tablet (2 mg total) by mouth daily.   cefdinir 300 MG capsule Commonly known as: OMNICEF Take 1 capsule (300 mg total) by mouth every 12 (twelve) hours.   celecoxib 100 MG capsule Commonly known as: CELEBREX Take 1 capsule (100 mg total) by mouth daily.   cholecalciferol 25 MCG (1000 UNIT) tablet Commonly known as: VITAMIN D3 Take 1 tablet (1,000 Units total) by mouth daily.   diltiazem 120 MG 24 hr capsule Commonly known as: CARDIZEM CD Take 1 capsule (120 mg total) by mouth daily. Start taking on: September 18, 2020   donepezil 10 MG tablet Commonly known as: ARICEPT Take 1 tablet (10 mg total) by mouth at bedtime.   escitalopram 10 MG tablet Commonly known as: LEXAPRO Take 1 tablet (10 mg total) by mouth daily.   estradiol 0.1 MG/GM vaginal cream Commonly known as: ESTRACE Place 1 Applicatorful vaginally 3 (three) times a week. Start taking on: September 18, 2020   feeding supplement Liqd Take 237 mLs by mouth 3 (three) times daily between meals. chocolate   levothyroxine 50 MCG tablet Commonly known as: SYNTHROID Take 1 tablet (50 mcg total) by mouth daily before breakfast.   loperamide 2 MG tablet Commonly known as: IMODIUM A-D Take 1 tablet (2 mg total) by mouth daily as needed for diarrhea or loose stools.   magnesium hydroxide 400 MG/5ML suspension Commonly known as: MILK OF MAGNESIA Take 30 mLs by mouth daily as needed for mild constipation.   metoprolol tartrate 25 MG tablet Commonly known as: LOPRESSOR Take 1 tablet (25 mg total) by mouth daily.   mirtazapine 7.5 MG tablet Commonly known as: REMERON Take 1 tablet (7.5 mg total) by mouth at bedtime.   ondansetron 4 MG disintegrating tablet Commonly known as: ZOFRAN-ODT Take 1 tablet (4 mg total) by mouth every 6 (six) hours as needed for nausea or vomiting.    pantoprazole 40 MG tablet Commonly known as: Protonix Take 1 tablet (40 mg total) by mouth 2 (two) times daily for 30 days, THEN 1 tablet (40 mg total) daily. Start taking on: September 17, 2020   polyethylene glycol 17 g packet Commonly known as: MIRALAX / GLYCOLAX Take 17 g by mouth daily.   polyvinyl alcohol 1.4 % ophthalmic solution Commonly known as: LIQUIFILM TEARS Place 2 drops into both eyes in the morning, at noon, and at bedtime.   rosuvastatin 5 MG tablet Commonly known as: CRESTOR Take 1 tablet (5 mg total) by mouth as directed. Take 1 tablet by mouth on Monday, Wednesday and  friday   triamcinolone 55 MCG/ACT Aero nasal inhaler Commonly known as: NASACORT Place 2 sprays into the nose daily.     Relevant Imaging Results:  Relevant Lab Results:   Additional Information SS# [id number], [address]

## 2020-09-16 NOTE — TOC Progression Note (Signed)
Transition of Care [facility]) - Progression Note    Patient Details  Name: Maureen Schaefer MRN: [id number] Date of Birth: 1927/07/20  Transition of [facility]) CM/SW Contact  Carley Hammed, [place] Phone Number: 09/16/2020, 4:36 PM  Clinical Narrative:    CSW coordinated with [facility]. Pt can DC back there tomorrow. Covid test was ordered. Facility just needs DC summary and updated FL2. SW will continue to follow for DC planning.        Expected Discharge Plan and Services                                                 Social Determinants of Health (SDOH) Interventions    Readmission Risk Interventions No flowsheet data found.

## 2020-09-17 ENCOUNTER — Encounter (HOSPITAL_COMMUNITY): Payer: Self-pay | Admitting: Internal Medicine

## 2020-09-17 DIAGNOSIS — N3080 Other cystitis without hematuria: Secondary | ICD-10-CM

## 2020-09-17 DIAGNOSIS — B9620 Unspecified Escherichia coli [E. coli] as the cause of diseases classified elsewhere: Secondary | ICD-10-CM | POA: Diagnosis not present

## 2020-09-17 DIAGNOSIS — K921 Melena: Secondary | ICD-10-CM | POA: Diagnosis not present

## 2020-09-17 DIAGNOSIS — I4891 Unspecified atrial fibrillation: Secondary | ICD-10-CM | POA: Diagnosis not present

## 2020-09-17 DIAGNOSIS — E440 Moderate protein-calorie malnutrition: Secondary | ICD-10-CM

## 2020-09-17 MED ORDER — PANTOPRAZOLE SODIUM 40 MG PO TBEC: 40.0000 mg | DELAYED_RELEASE_TABLET | Freq: Every day | ORAL | 0 refills | Status: DC

## 2020-09-17 MED ORDER — CELECOXIB 100 MG PO CAPS: 100.0000 mg | ORAL_CAPSULE | Freq: Every day | ORAL | 0 refills | Status: AC

## 2020-09-17 MED ORDER — CEFDINIR 300 MG PO CAPS: 300.0000 mg | ORAL_CAPSULE | Freq: Two times a day (BID) | ORAL | 0 refills | Status: AC

## 2020-09-17 MED ORDER — TRIAMCINOLONE ACETONIDE 55 MCG/ACT NA AERO: 2.0000 | INHALATION_SPRAY | Freq: Every day | NASAL | 12 refills | Status: AC

## 2020-09-17 MED ORDER — LOPERAMIDE HCL 2 MG PO TABS: 2.0000 mg | ORAL_TABLET | Freq: Every day | ORAL | 0 refills | Status: AC | PRN

## 2020-09-17 MED ORDER — ACETAMINOPHEN 500 MG PO TABS: 1000.0000 mg | ORAL_TABLET | Freq: Four times a day (QID) | ORAL | 0 refills | Status: AC | PRN

## 2020-09-17 MED ORDER — DILTIAZEM HCL ER COATED BEADS 120 MG PO CP24: 120.0000 mg | ORAL_CAPSULE | Freq: Every day | ORAL | 1 refills | Status: AC

## 2020-09-17 MED ORDER — PANTOPRAZOLE SODIUM 40 MG PO TBEC: DELAYED_RELEASE_TABLET | ORAL | 0 refills | Status: AC

## 2020-09-17 MED ORDER — VITAMIN D 25 MCG (1000 UNIT) PO TABS: 1000.0000 [IU] | ORAL_TABLET | Freq: Every day | ORAL | 0 refills | Status: AC

## 2020-09-17 MED ORDER — MAGNESIUM HYDROXIDE 400 MG/5ML PO SUSP: 30.0000 mL | Freq: Every day | ORAL | 0 refills | Status: AC | PRN

## 2020-09-17 MED ORDER — ONDANSETRON 4 MG PO TBDP: 4.0000 mg | ORAL_TABLET | Freq: Four times a day (QID) | ORAL | 0 refills | Status: AC | PRN

## 2020-09-17 MED ORDER — ESCITALOPRAM OXALATE 10 MG PO TABS: 10.0000 mg | ORAL_TABLET | Freq: Every day | ORAL | 0 refills | Status: AC

## 2020-09-17 MED ORDER — ESTRADIOL 0.1 MG/GM VA CREA: 1.0000 | TOPICAL_CREAM | VAGINAL | 12 refills | Status: AC

## 2020-09-17 MED ORDER — ALUM & MAG HYDROXIDE-SIMETH 200-200-20 MG/5ML PO SUSP: 30.0000 mL | Freq: Three times a day (TID) | ORAL | 0 refills | Status: AC | PRN

## 2020-09-17 MED ORDER — BOOST HIGH PROTEIN PO LIQD: 1.0000 | Freq: Three times a day (TID) | ORAL | 0 refills | Status: AC

## 2020-09-17 MED ORDER — ROSUVASTATIN CALCIUM 5 MG PO TABS: 5.0000 mg | ORAL_TABLET | ORAL | 0 refills | Status: AC

## 2020-09-17 MED ORDER — LEVOTHYROXINE SODIUM 50 MCG PO TABS: 50.0000 ug | ORAL_TABLET | Freq: Every day | ORAL | 0 refills | Status: AC

## 2020-09-17 MED ORDER — MIRTAZAPINE 7.5 MG PO TABS: 7.5000 mg | ORAL_TABLET | Freq: Every day | ORAL | 0 refills | Status: AC

## 2020-09-17 MED ORDER — POLYETHYLENE GLYCOL 3350 17 G PO PACK: 17.0000 g | PACK | Freq: Every day | ORAL | 0 refills | Status: AC

## 2020-09-17 MED ORDER — METOPROLOL TARTRATE 25 MG PO TABS: 25.0000 mg | ORAL_TABLET | Freq: Every day | ORAL | 0 refills | Status: AC

## 2020-09-17 MED ORDER — ARIPIPRAZOLE 2 MG PO TABS: 2.0000 mg | ORAL_TABLET | Freq: Every day | ORAL | 0 refills | Status: AC

## 2020-09-17 MED ORDER — POLYVINYL ALCOHOL 1.4 % OP SOLN: 2.0000 [drp] | Freq: Three times a day (TID) | OPHTHALMIC | 0 refills | Status: AC

## 2020-09-17 MED ORDER — DONEPEZIL HCL 10 MG PO TABS: 10.0000 mg | ORAL_TABLET | Freq: Every day | ORAL | 0 refills | Status: AC

## 2020-09-17 NOTE — Progress Notes (Signed)
Called report to [facility]. Report given to [facility] for [employer] Rm. 7. Awaiting PTAR for patient pickup.

## 2020-09-17 NOTE — TOC Transition Note (Signed)
Transition of Care [facility]) - CM/SW Discharge Note   Patient Details  Name: Maureen Schaefer MRN: [id number] Date of Birth: 1927-01-12  Transition of Care [facility]) CM/SW Contact:  Carley Hammed, LCSWA Phone Number: 09/17/2020, 3:03 PM   Clinical Narrative:     Nurse to call report to [phone] [facility] Rm 7   Final next level of care: Assisted Living     Patient Goals and CMS Choice        Discharge Placement              Patient chooses bed at: [facility] Patient to be transferred to facility by: PTAR Name of family member notified: Beola Cord Patient and family notified of of transfer: 09/17/20  Discharge Plan and Services                                     Social Determinants of Health (SDOH) Interventions     Readmission Risk Interventions No flowsheet data found.

## 2020-09-17 NOTE — Progress Notes (Signed)
   Subjective:   Overnight event: no overnight events.   Patient resting comfortably in bed. She denies any new complaints today. Objective:  Vital signs in last 24 hours: Vitals:   09/17/20 0546 09/17/20 0730 09/17/20 1056 09/17/20 1100  BP: (!) 169/60 (!) 163/88 (!) 152/87 (!) 152/57  Pulse:  93  (!) 108  Resp: 14 20    Temp: 97.9 F (36.6 C) 99 F (37.2 C)    TempSrc: Axillary Axillary    SpO2: 95% 95%    Weight:        Physical Exam  General: Pleasant, in no distress laying in bed.  Head: Normocephalic. Atraumatic. CV: RRR. No murmurs, rubs, or gallops. No LE edema Pulmonary: Lungs CTAB. Normal effort. No wheezing or rales. Abdominal: Soft, nontender, nondistended. Normal bowel sounds. Extremities: Palpable pulses. Normal ROM. Skin: Warm and dry. No obvious rash or lesions. Neuro: A&Ox3. Moves all extremities. Normal sensation. No focal deficit. Psych: Normal mood and affect  Assessment/Plan: Maureen Schaefer is a 86 year old woman with history of severe dementia, CAD, HTN, HLD, TIA, hypothyroidism, mitral valve prolapse, sleep apnea, polyneuropathy, PVCs who presents to the [facility] ED from her LTAC memory unit with nausea, vomiting, and malaise found to have anemia concerning for GI bleed and new-onset atrial fibrillation.   Active Problems:   Acute GI bleeding   Pressure injury of skin   Atrial fibrillation with RVR (HCC)   Malnutrition of moderate degree  Symptomatic anemia secondary to transient upper GI bleed Patient's hemoglobin has remained stable on PPI. She is transition to oral PPIs today. Per GI recommendations of care with the patient's family, we've elected to treat her symptomatic anemia and GI bleed with conservative medical treatment. -Continue PPI 40 mg twice daily for 1 month -Continue to avoid all medications that will increase her risk of GI bleed  Simple cystitis secondary to E. coli infection Patient has symptoms and urinalysis findings  consistent with a simple cystitis secondary to E. coli infection. She is tolerated ceftriaxone. She is on day 3 of ceftriaxone therapy. We will switch her to p.o. cefdinir for total of 6 days of antibiotic therapy. -Switch to cefdinir today for 3 days.  Atrial fibrillation: Patient presented with a new onset atrial fibrillation. She will be transitioned to oral diltiazem today and continued on oral metoprolol for rate control. Currently her rate is in the low 100s which is acceptable. I suspect this will decrease when getting stable on her oral diltiazem and metoprolol. -Continue diltiazem 120 mg daily -Continue metoprolol 25 mg daily -Patient will not be anticoagulated due to increased risk of bleeding.  Goals of care: Patient will be discharged back to [facility] based on conversations with family regarding goals of care  Dispo: Anticipated discharge in approximately 0 day(s).   Signed: Chari Manning, D.O.  Internal Medicine Resident, PGY-2 [facility]  Pager: [phone] 2:34 PM, 09/17/2020   After 5pm on weekdays and 1pm on weekends: On Call pager: [phone]

## 2020-10-22 DEATH — deceased

## 2022-07-16 IMAGING — CT CT HEAD W/O CM
4 series · 16 of 47 positions shown, 18 images · non-contrast
Comparison: May 25, 2020

CLINICAL DATA: Pain following fall

EXAM:
CT HEAD WITHOUT CONTRAST
TECHNIQUE: Contiguous axial images were obtained from the base of the skull
through the vertex without intravenous contrast.

[Series 503: head wo · axial · 0.40mm/px · z∈[-174,-54]mm · 7 of 33 slices shown, 9 images]
[im 5/33  brain]
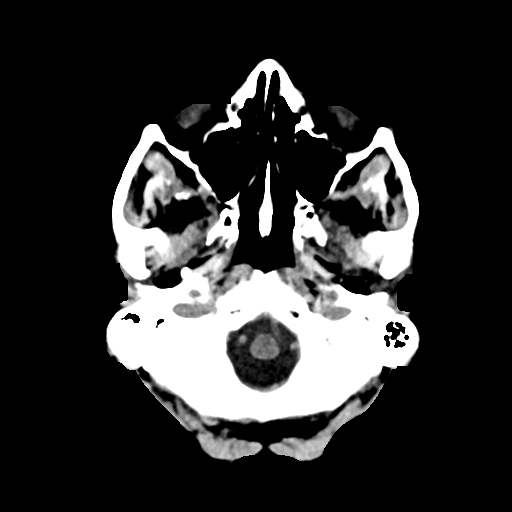
[im 5/33  bone]
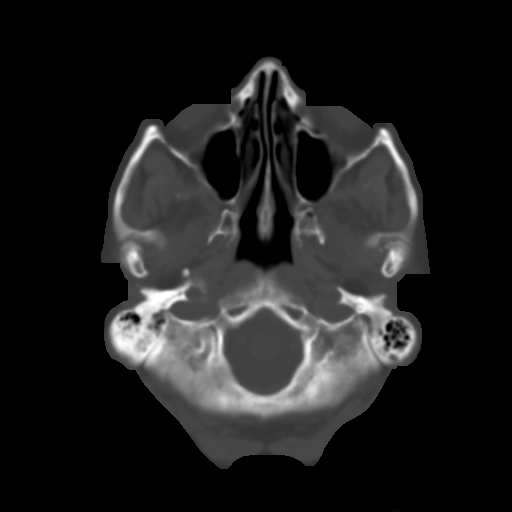
[im 9/33  brain]
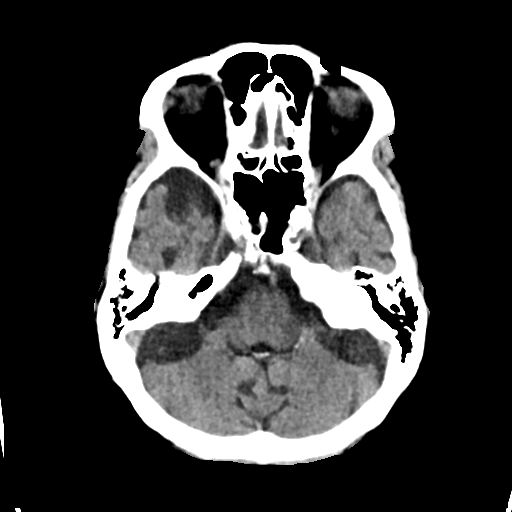
[im 13/33  brain]
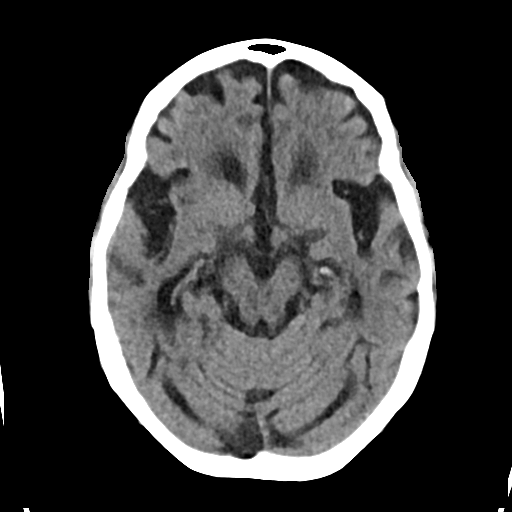
[im 17/33  brain]
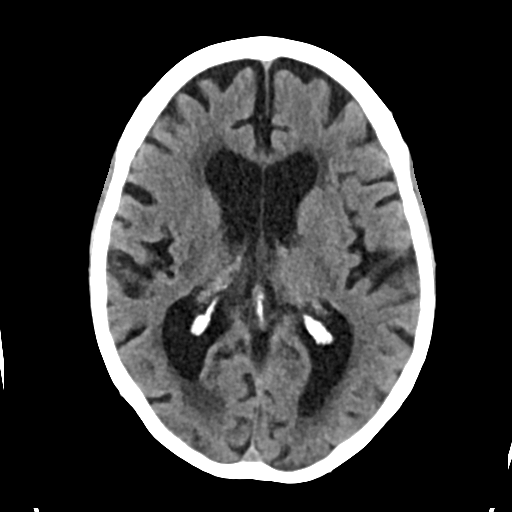
[im 21/33  brain]
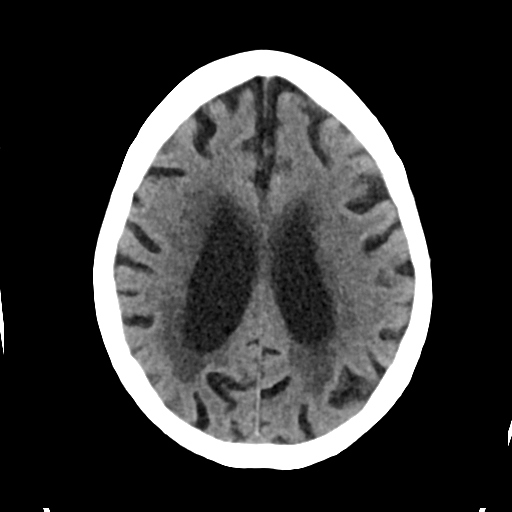
[im 21/33  bone]
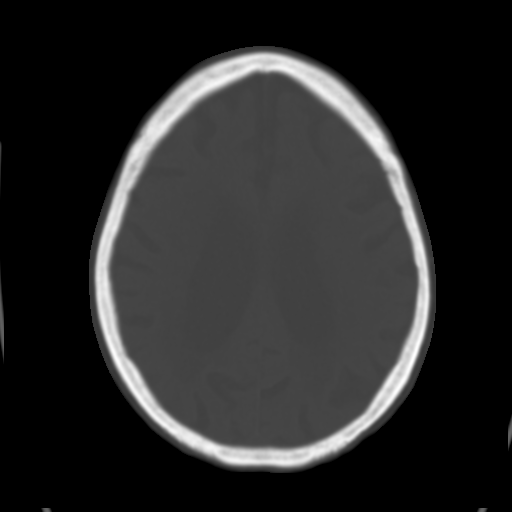
[im 25/33  brain]
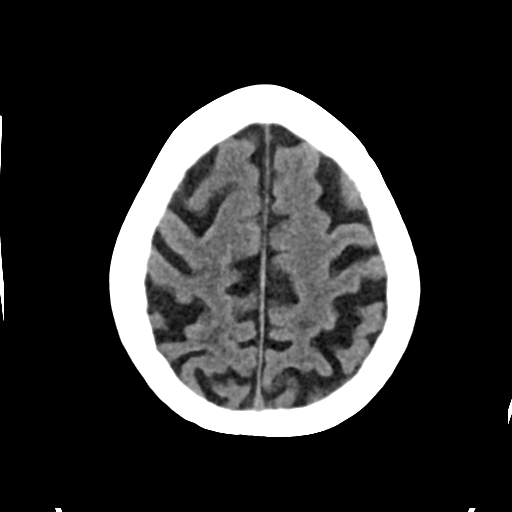
[im 29/33  brain]
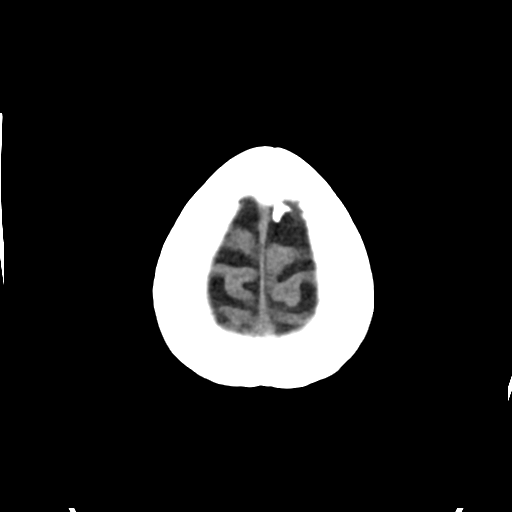

[Series 504: head bone · axial · 0.40mm/px · z∈[-178,-146]mm · 3 of 81 slices shown]
[im 9/81  bone]
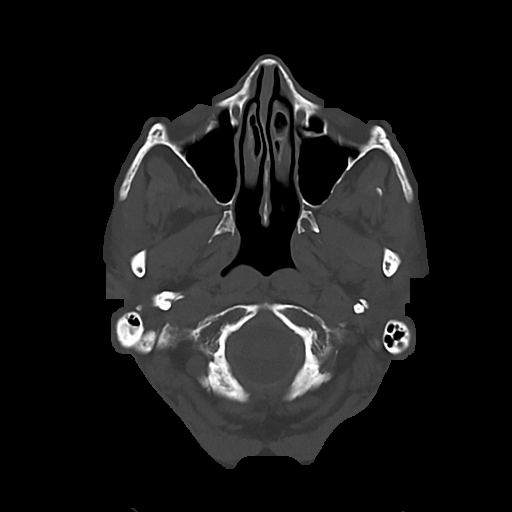
[im 17/81  bone]
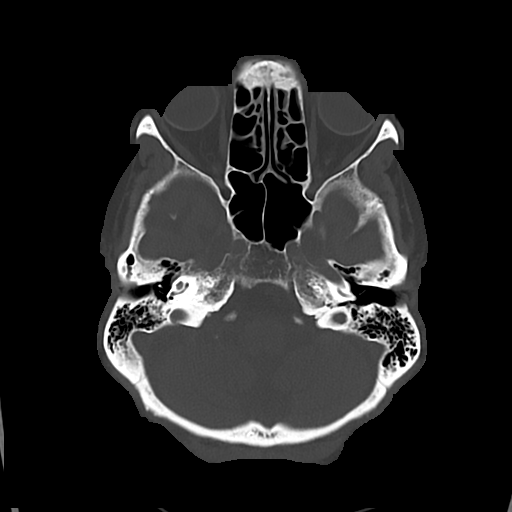
[im 25/81  bone]
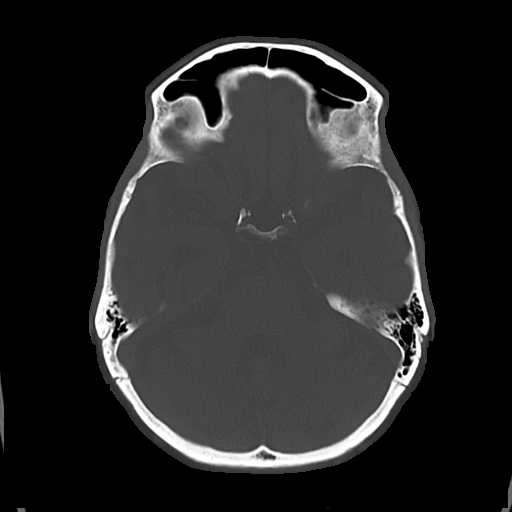

[Series 505: cor soft · coronal · 0.33mm/px · 3 of 69 slices shown]
[im 23/69  brain]
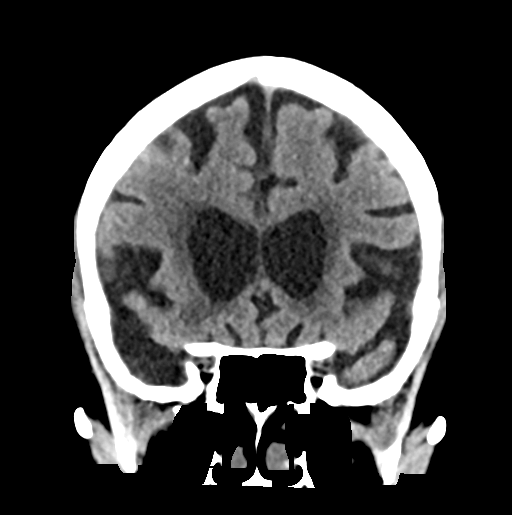
[im 31/69  brain]
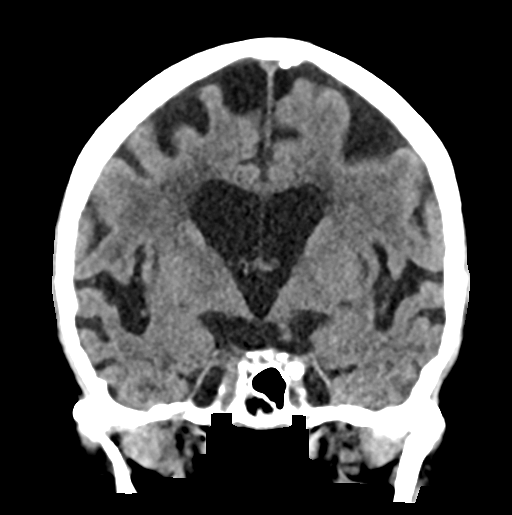
[im 38/69  brain]
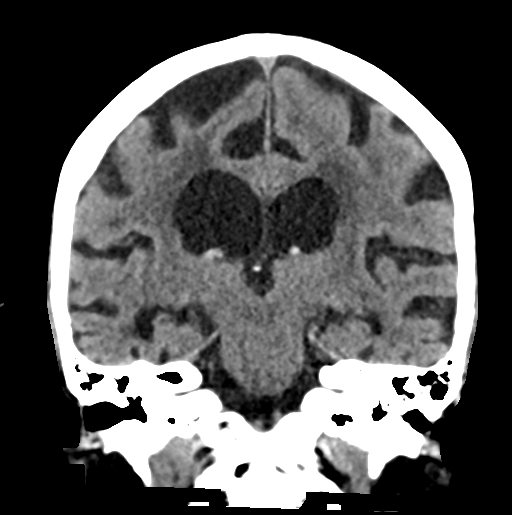

[Series 506: sag soft · sagittal · 0.33mm/px · 3 of 54 slices shown]
[im 18/54  brain]
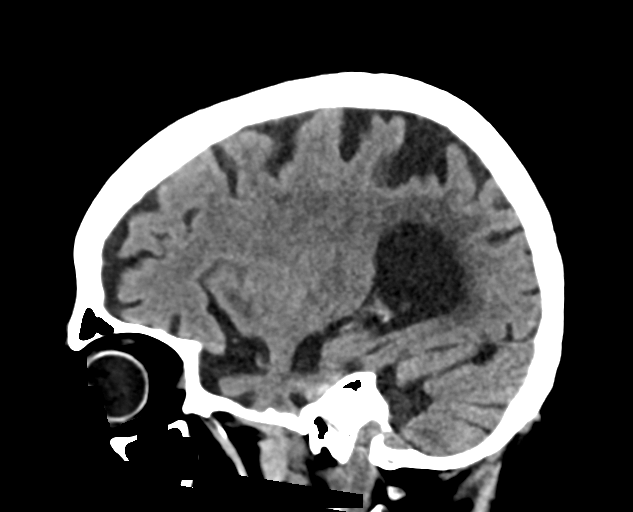
[im 27/54  brain]
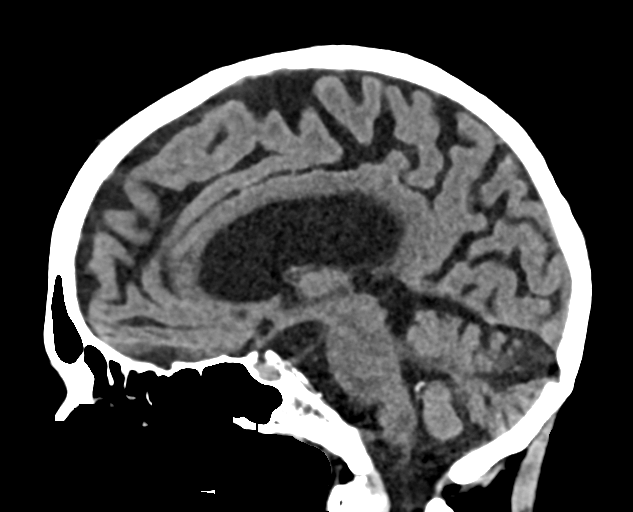
[im 36/54  brain]
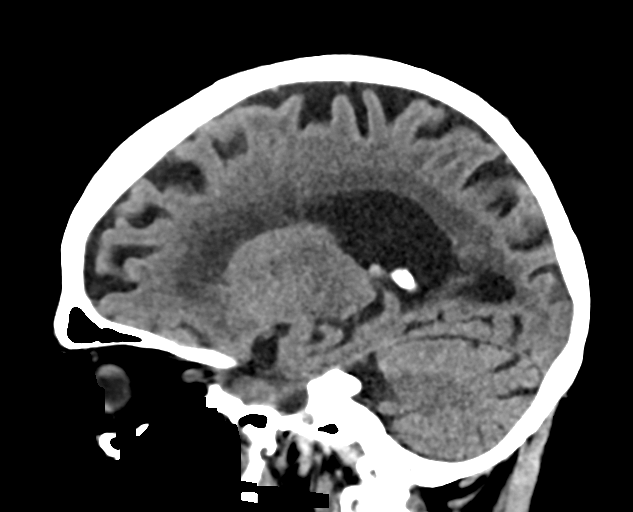

[16 of 47 positions shown; findings below may reference images not displayed]

FINDINGS: Brain: Moderate diffuse atrophy is stable. There is no intracranial
mass, hemorrhage, extra-axial fluid collection, or midline shift.
There is patchy small vessel disease in the centra semiovale
bilaterally. Evidence of a prior infarct in the posterior right
lentiform nucleus is stable. No acute appearing infarct is evident.

Vascular: No hyperdense vessel. There is calcification in each
carotid siphon region.

Skull: Bony calvarium appears intact.

Sinuses/Orbits: There is mucosal thickening in several ethmoid air
cells. Other visualized paranasal sinuses are clear. Orbits appear
symmetric bilaterally.

Other: Mastoid air cells are clear.
IMPRESSION: Stable atrophy with periventricular small vessel disease. Small
prior infarct in the posterior right lentiform nucleus is stable. No
acute appearing infarct. No evident mass or hemorrhage.

Foci of arterial vascular calcification noted. Mucosal thickening
noted in several ethmoid air cells.
